# Patient Record
Sex: Male | Born: 1965 | Race: White | Hispanic: No | Smoking: Current every day smoker
Health system: Southern US, Community
[De-identification: ages and names within clinical notes are randomized; demographics above are authoritative.]

## PROBLEM LIST (undated history)

## (undated) DIAGNOSIS — F329 Major depressive disorder, single episode, unspecified: Secondary | ICD-10-CM

## (undated) DIAGNOSIS — F1020 Alcohol dependence, uncomplicated: Secondary | ICD-10-CM

## (undated) DIAGNOSIS — G2581 Restless legs syndrome: Secondary | ICD-10-CM

## (undated) DIAGNOSIS — F32A Depression, unspecified: Secondary | ICD-10-CM

## (undated) DIAGNOSIS — M79606 Pain in leg, unspecified: Secondary | ICD-10-CM

## (undated) DIAGNOSIS — M549 Dorsalgia, unspecified: Secondary | ICD-10-CM

## (undated) DIAGNOSIS — M5430 Sciatica, unspecified side: Secondary | ICD-10-CM

## (undated) DIAGNOSIS — G8929 Other chronic pain: Secondary | ICD-10-CM

## (undated) HISTORY — PX: LEG SURGERY: SHX1003

## (undated) HISTORY — DX: Major depressive disorder, single episode, unspecified: F32.9

## (undated) HISTORY — DX: Depression, unspecified: F32.A

## (undated) HISTORY — DX: Alcohol dependence, uncomplicated: F10.20

## (undated) HISTORY — DX: Restless legs syndrome: G25.81

---

## 2012-07-18 ENCOUNTER — Ambulatory Visit (INDEPENDENT_AMBULATORY_CARE_PROVIDER_SITE_OTHER): Payer: Medicare Other | Admitting: Sports Medicine

## 2012-07-18 ENCOUNTER — Encounter: Payer: Self-pay | Admitting: Sports Medicine

## 2012-07-18 VITALS — BP 115/73 | HR 68 | Temp 98.3°F | Resp 18 | Ht 72.0 in | Wt 173.0 lb

## 2012-07-18 DIAGNOSIS — Z79899 Other long term (current) drug therapy: Secondary | ICD-10-CM

## 2012-07-18 DIAGNOSIS — G8929 Other chronic pain: Secondary | ICD-10-CM

## 2012-07-18 DIAGNOSIS — Z299 Encounter for prophylactic measures, unspecified: Secondary | ICD-10-CM | POA: Insufficient documentation

## 2012-07-18 DIAGNOSIS — N50819 Testicular pain, unspecified: Secondary | ICD-10-CM | POA: Insufficient documentation

## 2012-07-18 DIAGNOSIS — G2581 Restless legs syndrome: Secondary | ICD-10-CM

## 2012-07-18 DIAGNOSIS — F1020 Alcohol dependence, uncomplicated: Secondary | ICD-10-CM

## 2012-07-18 DIAGNOSIS — N509 Disorder of male genital organs, unspecified: Secondary | ICD-10-CM

## 2012-07-18 DIAGNOSIS — N529 Male erectile dysfunction, unspecified: Secondary | ICD-10-CM | POA: Insufficient documentation

## 2012-07-18 MED ORDER — SILDENAFIL CITRATE 100 MG PO TABS: 100.0000 mg | ORAL_TABLET | ORAL | Status: DC | PRN

## 2012-07-18 MED ORDER — GABAPENTIN 300 MG PO CAPS: ORAL_CAPSULE | ORAL | Status: DC

## 2012-07-18 MED ORDER — HYDROCODONE-ACETAMINOPHEN 10-500 MG PO TABS: 1.0000 | ORAL_TABLET | Freq: Two times a day (BID) | ORAL | Status: DC | PRN

## 2012-07-18 NOTE — Progress Notes (Signed)
Patient ID: Jimmy Barron, male   DOB: 1966/08/25, 46 y.o.   MRN: [id number] Subjective:    CC: Establish care.   HPI: Jimmy Barron is a pleasant 46 year old male comes in to discuss several issues.  Restless leg syndrome: He has been on Requip, is not like the way this makes a few, and would like a different option.  Chronic pain: Has a history of a suicide attempt, and off of a bridge. He had a severe fracture of his left femur treated with an ORIF. 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 He's been getting Lortab refills. He requests that I take over his pain management.  Right testicular pain: Does not radiate, does not occur with urination, is only worse with palpation. Has been present for several months, denies fevers, swelling, redness. Has not found any lumps. Is wondering if I can offer the answers. He also denies any swelling in the scrotum.  Erectile dysfunction: Is wondering if he can get some Viagra.  Past medical history, Surgical history, Family history, Social history, Allergies, and medications have been entered into the medical record, reviewed, and no changes needed.   Review of Systems: No headache, visual changes, nausea, vomiting, diarrhea, constipation, dizziness, abdominal pain, skin rash, fevers, chills, night sweats, weight loss, chest pain, body aches, joint swelling, muscle aches, or shortness of breath.   Objective:    General: Well Developed, well nourished, and in no acute distress.  Neuro: Alert and oriented x3, extra-ocular muscles intact.  HEENT: Normocephalic, atraumatic, pupils equal round reactive to light, neck supple, no masses, no lymphadenopathy, thyroid nonpalpable.  Skin: Warm and dry, no rashes noted.  Cardiac: Regular rate and rhythm, no murmurs rubs or gallops.  Respiratory: Clear to auscultation bilaterally. Not using accessory muscles,  speaking in full sentences.  Abdominal: Soft, nontender, nondistended, positive bowel sounds, no masses, no organomegaly.  Musculoskeletal: Shoulder, elbow, wrist, hip, knee, ankle stable, and with full range of motion.  Noted surgical scars over his left hip, and leg. Genitalia: Right testicle is unremarkable in appearance: There's no tenderness to palpation. There is no sign of hernia.  There is no penile discharge.   Impression and Recommendations:

## 2012-07-18 NOTE — Assessment & Plan Note (Signed)
CBC, complete metabolic panel, fasting lipid panel.

## 2012-07-18 NOTE — Assessment & Plan Note (Signed)
Inadequate response with Requip. We'll go ahead and try Neurontin a taper. RTC one month for this.

## 2012-07-18 NOTE — Assessment & Plan Note (Addendum)
One month of Lortab for use up to twice a day given today. He understands this is a one time prescription.  We will go and get him set up with the pain clinic. Eventually I may consider x-rays of his hip, femur, and knee to further assess for loosening of the components.

## 2012-07-18 NOTE — Assessment & Plan Note (Signed)
We will follow this closely. I will offer him help should he desire.

## 2012-07-18 NOTE — Assessment & Plan Note (Signed)
Exam benign. No sign of hernia. We'll go ahead and ultrasound. Referral to urology if no cause found.

## 2012-07-18 NOTE — Assessment & Plan Note (Signed)
Checking testosterone. Viagra

## 2012-07-23 ENCOUNTER — Telehealth: Payer: Self-pay

## 2012-07-23 NOTE — Telephone Encounter (Signed)
Message copied by Chalmers Cater on Wed Jul 23, 2012  4:00 PM ------      Message from: Monica Becton      Created: Wed Jul 23, 2012 10:20 AM       Shoot this guy a call, seems he hasn;t showed up for his labwork yet.  Just shoot him a reminder.      -T

## 2012-07-23 NOTE — Telephone Encounter (Signed)
Left message for patient to call back  

## 2012-07-29 NOTE — Telephone Encounter (Signed)
I called patient again today. His phone number is no longer in service.

## 2012-08-12 ENCOUNTER — Telehealth: Payer: Self-pay | Admitting: *Deleted

## 2012-08-12 NOTE — Telephone Encounter (Signed)
Pt's sister has called stating that the Lortab you prescribed to her brother (whom she is payee on his monthly check and whom he lived with) states that her friend has called her stating that her brother is selling his Lortab and that he informed the friend that he would be getting another refill on the 2nd. She ask that we don't prescribe narcotics to him anymore.

## 2012-08-12 NOTE — Telephone Encounter (Signed)
Noted, no further narcotics from this practice.

## 2012-08-15 ENCOUNTER — Encounter: Payer: Self-pay | Admitting: Sports Medicine

## 2012-08-15 ENCOUNTER — Other Ambulatory Visit: Payer: Self-pay | Admitting: Sports Medicine

## 2012-08-15 ENCOUNTER — Ambulatory Visit (HOSPITAL_BASED_OUTPATIENT_CLINIC_OR_DEPARTMENT_OTHER)
Admission: RE | Admit: 2012-08-15 | Discharge: 2012-08-15 | Disposition: A | Discharge status: Home / Self Care | Payer: Medicare Other | Source: Ambulatory Visit | Attending: Sports Medicine | Admitting: Sports Medicine

## 2012-08-15 ENCOUNTER — Ambulatory Visit (INDEPENDENT_AMBULATORY_CARE_PROVIDER_SITE_OTHER): Payer: Medicare Other | Admitting: Sports Medicine

## 2012-08-15 ENCOUNTER — Telehealth: Payer: Self-pay | Admitting: Sports Medicine

## 2012-08-15 VITALS — BP 124/84 | HR 75 | Wt 175.0 lb

## 2012-08-15 DIAGNOSIS — Z299 Encounter for prophylactic measures, unspecified: Secondary | ICD-10-CM

## 2012-08-15 DIAGNOSIS — N50819 Testicular pain, unspecified: Secondary | ICD-10-CM

## 2012-08-15 DIAGNOSIS — N509 Disorder of male genital organs, unspecified: Secondary | ICD-10-CM

## 2012-08-15 DIAGNOSIS — N529 Male erectile dysfunction, unspecified: Secondary | ICD-10-CM

## 2012-08-15 DIAGNOSIS — G2581 Restless legs syndrome: Secondary | ICD-10-CM

## 2012-08-15 DIAGNOSIS — Z79899 Other long term (current) drug therapy: Secondary | ICD-10-CM

## 2012-08-15 DIAGNOSIS — Z0289 Encounter for other administrative examinations: Secondary | ICD-10-CM

## 2012-08-15 DIAGNOSIS — N508 Other specified disorders of male genital organs: Secondary | ICD-10-CM | POA: Insufficient documentation

## 2012-08-15 DIAGNOSIS — G8929 Other chronic pain: Secondary | ICD-10-CM

## 2012-08-15 LAB — COMPREHENSIVE METABOLIC PANEL
AST: 22 U/L (ref 0–37)
Alkaline Phosphatase: 59 U/L (ref 39–117)
BUN: 15 mg/dL (ref 6–23)
Creat: 0.77 mg/dL (ref 0.50–1.35)
Glucose, Bld: 84 mg/dL (ref 70–99)
Total Bilirubin: 1 mg/dL (ref 0.3–1.2)

## 2012-08-15 LAB — COMPREHENSIVE METABOLIC PANEL WITH GFR
ALT: 19 U/L (ref 0–53)
Albumin: 4.8 g/dL (ref 3.5–5.2)
CO2: 28 meq/L (ref 19–32)
Calcium: 9.8 mg/dL (ref 8.4–10.5)
Chloride: 100 meq/L (ref 96–112)
Potassium: 4.2 meq/L (ref 3.5–5.3)
Sodium: 136 meq/L (ref 135–145)
Total Protein: 7.4 g/dL (ref 6.0–8.3)

## 2012-08-15 LAB — CBC
HCT: 46.8 % (ref 39.0–52.0)
Hemoglobin: 16.2 g/dL (ref 13.0–17.0)
MCH: 32 pg (ref 26.0–34.0)
MCHC: 34.6 g/dL (ref 30.0–36.0)
MCV: 92.5 fL (ref 78.0–100.0)
Platelets: 188 K/uL (ref 150–400)
RBC: 5.06 MIL/uL (ref 4.22–5.81)
RDW: 13.7 % (ref 11.5–15.5)
WBC: 8.1 K/uL (ref 4.0–10.5)

## 2012-08-15 LAB — LIPID PANEL
Cholesterol: 184 mg/dL (ref 0–200)
HDL: 55 mg/dL (ref >39)
LDL Cholesterol: 107 mg/dL — ABNORMAL HIGH (ref 0–99)
Total CHOL/HDL Ratio: 3.3 Ratio
Triglycerides: 112 mg/dL (ref <150)
VLDL: 22 mg/dL (ref 0–40)

## 2012-08-15 LAB — TESTOSTERONE, FREE, TOTAL, SHBG

## 2012-08-15 MED ORDER — KETOROLAC TROMETHAMINE 30 MG/ML IJ SOLN
30.0000 mg | Freq: Once | INTRAMUSCULAR | Status: AC
  Administered 2012-08-15: 30 mg via INTRAMUSCULAR

## 2012-08-15 NOTE — Addendum Note (Signed)
Addended by: Avon Gully C on: 08/15/2012 10:08 AM   Modules accepted: Orders

## 2012-08-15 NOTE — Assessment & Plan Note (Addendum)
CBC, CMET, Testosterone, FLP Up-to-date on tetanus, declines influenza vaccination.

## 2012-08-15 NOTE — Assessment & Plan Note (Signed)
Hasn't yet gotten the Viagra. I would like to check a testosterone level, and replete if needed.

## 2012-08-15 NOTE — Assessment & Plan Note (Signed)
Need to u/s right testicle with hx pain.

## 2012-08-15 NOTE — Assessment & Plan Note (Signed)
Improved with gabapentin. 

## 2012-08-15 NOTE — Progress Notes (Signed)
Subjective:    CC: Followup  HPI: Restless legs: Improved with gabapentin.  Right testicular pain: Still not obtained the ultrasound. No change.  Preventive care: Has not gotten his blood work, he would do this today. He declines influenza vaccination, and notes that his tetanus shot is up-to-date 4 years ago.  Chronic pain: Understands we will not prescribe narcotics, we do have a pain clinic referral pending.  Past medical history, Surgical history, Family history, Social history, Allergies, and medications have been entered into the medical record, reviewed, and no changes needed.   Review of Systems: No fevers, chills, night sweats, weight loss, chest pain, or shortness of breath.   Objective:    General: Well Developed, well nourished, and in no acute distress.  Neuro: Alert and oriented x3, extra-ocular muscles intact.  HEENT: Normocephalic, atraumatic, pupils equal round reactive to light, neck supple, no masses, no lymphadenopathy, thyroid nonpalpable.  Skin: Warm and dry, no rashes. Cardiac: Regular rate and rhythm, no murmurs rubs or gallops.  Respiratory: Clear to auscultation bilaterally. Not using accessory muscles, speaking in full sentences.   Impression and Recommendations:

## 2012-08-15 NOTE — Telephone Encounter (Signed)
Pt notified. KG LPN 

## 2012-08-15 NOTE — Assessment & Plan Note (Addendum)
Still working on pain clinic referral. Can always consider x-ray of the hardware to assess for loosening in the future if needed. He also understands he can continue to taper gabapentin to help his chronic pain. I'm also going to give him a shot of Toradol 30 mg intramuscular today.

## 2012-08-15 NOTE — Telephone Encounter (Signed)
Let him know ultraosund was negative.

## 2012-09-29 ENCOUNTER — Telehealth: Payer: Self-pay

## 2012-09-29 NOTE — Telephone Encounter (Signed)
Left message for patient to call back. There was a message left on the voicemail.

## 2012-12-01 ENCOUNTER — Telehealth: Payer: Self-pay | Admitting: *Deleted

## 2012-12-01 NOTE — Telephone Encounter (Signed)
Sister calls and wanted you to know that her brother is abusing the gabapentin. Is snorting them and selling them and she said she went to pick up his rx from pharmacy and keeps asking her to call up here to have you increase the dose and she is not gonna have any part of it and will be flushing that rx she picked up down the toilet.  She calls to make you aware of what he is doing and states he has a hx of abusing meds

## 2012-12-01 NOTE — Telephone Encounter (Signed)
Thanks, I havent seen him since September, and haven't sent in gabapentin since august.  Will notate his chart.

## 2013-02-12 ENCOUNTER — Ambulatory Visit: Payer: Medicare Other | Admitting: Sports Medicine

## 2013-02-12 DIAGNOSIS — Z0289 Encounter for other administrative examinations: Secondary | ICD-10-CM

## 2013-03-13 ENCOUNTER — Ambulatory Visit (INDEPENDENT_AMBULATORY_CARE_PROVIDER_SITE_OTHER): Payer: Medicare Other

## 2013-03-13 ENCOUNTER — Ambulatory Visit (INDEPENDENT_AMBULATORY_CARE_PROVIDER_SITE_OTHER): Payer: Medicare Other | Admitting: Sports Medicine

## 2013-03-13 ENCOUNTER — Encounter: Payer: Self-pay | Admitting: Sports Medicine

## 2013-03-13 ENCOUNTER — Other Ambulatory Visit: Payer: Self-pay | Admitting: Sports Medicine

## 2013-03-13 VITALS — BP 125/84 | HR 60 | Wt 178.0 lb

## 2013-03-13 DIAGNOSIS — M25569 Pain in unspecified knee: Secondary | ICD-10-CM

## 2013-03-13 DIAGNOSIS — G8929 Other chronic pain: Secondary | ICD-10-CM

## 2013-03-13 DIAGNOSIS — F1020 Alcohol dependence, uncomplicated: Secondary | ICD-10-CM

## 2013-03-13 DIAGNOSIS — IMO0002 Reserved for concepts with insufficient information to code with codable children: Secondary | ICD-10-CM

## 2013-03-13 DIAGNOSIS — S32009A Unspecified fracture of unspecified lumbar vertebra, initial encounter for closed fracture: Secondary | ICD-10-CM

## 2013-03-13 DIAGNOSIS — M25559 Pain in unspecified hip: Secondary | ICD-10-CM

## 2013-03-13 DIAGNOSIS — M79605 Pain in left leg: Secondary | ICD-10-CM | POA: Insufficient documentation

## 2013-03-13 DIAGNOSIS — F329 Major depressive disorder, single episode, unspecified: Secondary | ICD-10-CM

## 2013-03-13 DIAGNOSIS — X58XXXA Exposure to other specified factors, initial encounter: Secondary | ICD-10-CM

## 2013-03-13 DIAGNOSIS — F3289 Other specified depressive episodes: Secondary | ICD-10-CM

## 2013-03-13 DIAGNOSIS — M5416 Radiculopathy, lumbar region: Secondary | ICD-10-CM

## 2013-03-13 DIAGNOSIS — F32A Depression, unspecified: Secondary | ICD-10-CM | POA: Insufficient documentation

## 2013-03-13 MED ORDER — PREDNISONE 50 MG PO TABS: ORAL_TABLET | ORAL | Status: DC

## 2013-03-13 MED ORDER — CYCLOBENZAPRINE HCL 10 MG PO TABS: ORAL_TABLET | ORAL | Status: DC

## 2013-03-13 MED ORDER — MELOXICAM 15 MG PO TABS: ORAL_TABLET | ORAL | Status: DC

## 2013-03-13 NOTE — Assessment & Plan Note (Signed)
Highly possible that the pain is he describes is not related to his femur fracture ORIF, and is more related to lumbar radiculitis. I am going to x-ray his lumbar spine as well as his left knee. Prednisone, Flexeril, Mobic, formal physical therapy. He'll come back in one month.

## 2013-03-13 NOTE — Progress Notes (Signed)
  Subjective:    CC: Followup  HPI: Left leg pain: Phineas has been extremity the pain in his left leg to his distant femur fracture and ORIF. He localizes the pain over the left back, left greater trochanter radiating around to the anterior thigh, anterior knee, lateral lower leg, into the dorsum of his foot causing numbness and tingling. This is worse when sitting in a car, and sitting for long periods of time, no worse with Valsalva. Better with extension. No loss of bowel or bladder function, and no constitutional symptoms. He was seen by a nurse at [facility] during his recent alcohol detox hospitalization. He was placed in a knee immobilizer.  Alcoholism: Recently discharged and is currently 3 weeks sober.  Past medical history, Surgical history, Family history not pertinant except as noted below, Social history, Allergies, and medications have been entered into the medical record, reviewed, and no changes needed.   Review of Systems: No fevers, chills, night sweats, weight loss, chest pain, or shortness of breath.   Objective:    General: Well Developed, well nourished, and in no acute distress.  Neuro: Alert and oriented x3, extra-ocular muscles intact, sensation grossly intact.  HEENT: Normocephalic, atraumatic, pupils equal round reactive to light, neck supple, no masses, no lymphadenopathy, thyroid nonpalpable.  Skin: Warm and dry, no rashes. Cardiac: Regular rate and rhythm, no murmurs rubs or gallops, no lower extremity edema.  Respiratory: Clear to auscultation bilaterally. Not using accessory muscles, speaking in full sentences. Back Exam:  Inspection: Unremarkable  Motion: Flexion 45 deg, Extension 45 deg, Side Bending to 45 deg bilaterally,  Rotation to 45 deg bilaterally  SLR laying: Positive reproduction of radicular symptoms into left foot.  XSLR laying: Negative  Palpable tenderness: None. FABER: negative. Sensory change: Gross sensation intact to all lumbar and  sacral dermatomes.  Reflexes: 2+ at both patellar tendons, 2+ at achilles tendons, Babinski's downgoing.  Strength at foot  Plantar-flexion: 5/5 Dorsi-flexion: 5/5 Eversion: 5/5 Inversion: 5/5  Leg strength  Quad: 5/5 Hamstring: 5/5 Hip flexor: 5/5 Hip abductors: 5/5  Gait unremarkable.  Left Knee: Normal to inspection with no erythema or effusion or obvious bony abnormalities. Palpation normal with no warmth, joint line tenderness, patellar tenderness, or condyle tenderness. ROM full in flexion and extension and lower leg rotation. Ligaments with solid consistent endpoints including ACL, PCL, LCL, MCL. Negative Mcmurray's, Apley's, and Thessalonian tests. Non painful patellar compression. Patellar glide without crepitus. Patellar and quadriceps tendons unremarkable. Hamstring and quadriceps strength is normal.   Impression and Recommendations:

## 2013-03-13 NOTE — Assessment & Plan Note (Signed)
Has had approximately 3 weeks clean. Recently was released from an inpatient detox facility.

## 2013-03-16 ENCOUNTER — Telehealth: Payer: Self-pay | Admitting: *Deleted

## 2013-03-16 MED ORDER — VENLAFAXINE HCL ER 150 MG PO CP24: 150.0000 mg | ORAL_CAPSULE | Freq: Every day | ORAL | Status: DC

## 2013-03-16 NOTE — Telephone Encounter (Signed)
LMOM that Effexor was sent to pharmacy but not Neurotin.

## 2013-03-16 NOTE — Telephone Encounter (Signed)
Pt calls and request a refill on the Neurontin and mood stabalizer. Uses [facility] [address]

## 2013-03-16 NOTE — Telephone Encounter (Signed)
Refilling Effexor. No gabapentin.

## 2013-03-26 ENCOUNTER — Telehealth: Payer: Self-pay | Admitting: *Deleted

## 2013-03-26 NOTE — Telephone Encounter (Signed)
Can't do that one, check specialty comments on the snapshot.

## 2013-03-26 NOTE — Telephone Encounter (Signed)
Thank you for that heads up!

## 2013-03-26 NOTE — Telephone Encounter (Signed)
Pt calls today & is asking if we can refill his gabapentin.  I don't see this on his current med list but it's on his med history list.  Please advise

## 2013-03-27 ENCOUNTER — Telehealth: Payer: Self-pay | Admitting: *Deleted

## 2013-03-27 NOTE — Telephone Encounter (Signed)
Not a problem, exercises are in my out box.

## 2013-03-27 NOTE — Telephone Encounter (Signed)
Because his pain may be coming from his lumbar spine, I wanted him to do the medication as well as physical therapy is recommended in his office visit. He should continue this course, and proceed with physical therapy. If he can't do formal physical therapy we can do home exercises. I went in to see me back one month after the last visit, if no better we will obtain an MRI of his lumbar spine for injection planning. It's too early for pain clinic referral because I have not yet exhausted conservative measures. Please have him bear with me a little longer.

## 2013-03-27 NOTE — Telephone Encounter (Signed)
Pt states if we can print out excercises and place them up front for him to pick up because he states he has trouble getting a ride to PT.

## 2013-03-27 NOTE — Telephone Encounter (Signed)
Pt is asking since we won't refill his gabapentin he wants to know if we can refer him to a pain clinic.

## 2013-04-10 ENCOUNTER — Ambulatory Visit: Payer: Medicare Other | Admitting: Sports Medicine

## 2013-04-10 DIAGNOSIS — Z0289 Encounter for other administrative examinations: Secondary | ICD-10-CM

## 2013-04-14 ENCOUNTER — Other Ambulatory Visit: Payer: Self-pay | Admitting: *Deleted

## 2013-04-14 MED ORDER — VENLAFAXINE HCL ER 150 MG PO CP24: 150.0000 mg | ORAL_CAPSULE | Freq: Every day | ORAL | Status: DC

## 2013-04-15 ENCOUNTER — Ambulatory Visit: Payer: Medicare Other | Admitting: Sports Medicine

## 2013-04-15 DIAGNOSIS — Z0289 Encounter for other administrative examinations: Secondary | ICD-10-CM

## 2014-03-09 ENCOUNTER — Emergency Department (HOSPITAL_COMMUNITY)
Admission: EM | Admit: 2014-03-09 | Discharge: 2014-03-09 | Disposition: A | Discharge status: Home / Self Care | Payer: Medicare Other | Attending: Emergency Medicine | Admitting: Emergency Medicine

## 2014-03-09 ENCOUNTER — Encounter (HOSPITAL_COMMUNITY): Payer: Self-pay | Admitting: Emergency Medicine

## 2014-03-09 DIAGNOSIS — Z79899 Other long term (current) drug therapy: Secondary | ICD-10-CM | POA: Insufficient documentation

## 2014-03-09 DIAGNOSIS — F17200 Nicotine dependence, unspecified, uncomplicated: Secondary | ICD-10-CM | POA: Insufficient documentation

## 2014-03-09 DIAGNOSIS — F329 Major depressive disorder, single episode, unspecified: Secondary | ICD-10-CM | POA: Insufficient documentation

## 2014-03-09 DIAGNOSIS — G8929 Other chronic pain: Secondary | ICD-10-CM | POA: Insufficient documentation

## 2014-03-09 DIAGNOSIS — IMO0002 Reserved for concepts with insufficient information to code with codable children: Secondary | ICD-10-CM | POA: Insufficient documentation

## 2014-03-09 DIAGNOSIS — F3289 Other specified depressive episodes: Secondary | ICD-10-CM | POA: Insufficient documentation

## 2014-03-09 DIAGNOSIS — G2581 Restless legs syndrome: Secondary | ICD-10-CM | POA: Insufficient documentation

## 2014-03-09 DIAGNOSIS — M545 Low back pain, unspecified: Secondary | ICD-10-CM | POA: Insufficient documentation

## 2014-03-09 DIAGNOSIS — M549 Dorsalgia, unspecified: Secondary | ICD-10-CM

## 2014-03-09 DIAGNOSIS — F1021 Alcohol dependence, in remission: Secondary | ICD-10-CM | POA: Insufficient documentation

## 2014-03-09 HISTORY — DX: Dorsalgia, unspecified: M54.9

## 2014-03-09 HISTORY — DX: Pain in leg, unspecified: M79.606

## 2014-03-09 HISTORY — DX: Other chronic pain: G89.29

## 2014-03-09 HISTORY — DX: Sciatica, unspecified side: M54.30

## 2014-03-09 MED ORDER — HYDROCODONE-ACETAMINOPHEN 5-325 MG PO TABS: ORAL_TABLET | ORAL | Status: DC

## 2014-03-09 MED ORDER — METHOCARBAMOL 500 MG PO TABS: 1000.0000 mg | ORAL_TABLET | Freq: Four times a day (QID) | ORAL | Status: DC | PRN

## 2014-03-09 MED ORDER — NAPROXEN 250 MG PO TABS: 250.0000 mg | ORAL_TABLET | Freq: Two times a day (BID) | ORAL | Status: DC

## 2014-03-09 NOTE — ED Notes (Signed)
Pt c/o pain in lower back and left leg.  Reports jumped off bridge 6 years ago and has had problems with back and leg pain since then.

## 2014-03-09 NOTE — ED Provider Notes (Signed)
CSN: [id number]     Arrival date & time 03/09/14  0732 History   First MD Initiated Contact with Patient 03/09/14 [phone]     Chief Complaint  Patient presents with  . Back Pain     HPI Pt was seen at 0745. Per pt, c/o gradual onset and persistence of constant acute flair of his chronic low back "pain" for the past several days.  Denies any change in his usual chronic pain pattern.  Pain worsens with palpation of the area and body position changes. Denies incont/retention of bowel or bladder, no saddle anesthesia, no focal motor weakness, no tingling/numbness in extremities, no fevers, no injury, no abd pain.   The symptoms have been associated with no other complaints. The patient has a significant history of similar symptoms previously, recently being evaluated for this complaint and multiple prior evals for same.     Past Medical History  Diagnosis Date  . Alcoholism   . Depression   . Restless legs syndrome (RLS)   . Chronic back pain   . Sciatica   . Chronic leg pain     left   Past Surgical History  Procedure Laterality Date  . Leg surgery      left   Family History  Problem Relation Age of Onset  . Alcohol abuse Father   . Cancer Father     lung  . Alzheimer's disease Mother    History  Substance Use Topics  . Smoking status: Current Every Day Smoker -- 1.00 packs/day for 30 years    Types: Cigarettes  . Smokeless tobacco: Former NeurosurgeonUser    Types: Chew  . Alcohol Use: Yes     Comment: none since 16 days    Review of Systems ROS: Statement: All systems negative except as marked or noted in the HPI; Constitutional: Negative for fever and chills. ; ; Eyes: Negative for eye pain, redness and discharge. ; ; ENMT: Negative for ear pain, hoarseness, nasal congestion, sinus pressure and sore throat. ; ; Cardiovascular: Negative for chest pain, palpitations, diaphoresis, dyspnea and peripheral edema. ; ; Respiratory: Negative for cough, wheezing and stridor. ; ;  Gastrointestinal: Negative for nausea, vomiting, diarrhea, abdominal pain, blood in stool, hematemesis, jaundice and rectal bleeding. . ; ; Genitourinary: Negative for dysuria, flank pain and hematuria. ; ; Musculoskeletal: +LBP. Negative for neck pain. Negative for swelling and trauma.; ; Skin: Negative for pruritus, rash, abrasions, blisters, bruising and skin lesion.; ; Neuro: Negative for headache, lightheadedness and neck stiffness. Negative for weakness, altered level of consciousness , altered mental status, extremity weakness, paresthesias, involuntary movement, seizure and syncope.        Allergies  Review of patient's allergies indicates no known allergies.  Home Medications   Prior to Admission medications   Medication Sig Start Date End Date Taking? Authorizing Provider  cyclobenzaprine (FLEXERIL) 10 MG tablet One half tab PO qHS, then increase gradually to one tab TID. 03/13/13   Monica Bectonhomas J Thekkekandam, MD  HYDROcodone-acetaminophen (NORCO/VICODIN) 5-325 MG per tablet 1 or 2 tabs PO q6 hours prn pain 03/09/14   Laray AngerKathleen M Antonieta Slaven, DO  hydrOXYzine (ATARAX/VISTARIL) 50 MG tablet Take 50 mg by mouth 3 (three) times daily as needed.    Historical Provider, MD  meloxicam (MOBIC) 15 MG tablet One tab PO qAM with breakfast for 2 weeks, then daily prn pain. 03/13/13   Monica Bectonhomas J Thekkekandam, MD  methocarbamol (ROBAXIN) 500 MG tablet Take 2 tablets (1,000 mg total) by mouth 4 (four)  times daily as needed for muscle spasms (muscle spasm/pain). 03/09/14   Laray AngerKathleen M Christeen Lai, DO  naproxen (NAPROSYN) 250 MG tablet Take 1 tablet (250 mg total) by mouth 2 (two) times daily with a meal. 03/09/14   Laray AngerKathleen M Hanifah Royse, DO  predniSONE (DELTASONE) 50 MG tablet One tab PO daily for 5 days. 03/13/13   Monica Bectonhomas J Thekkekandam, MD  rOPINIRole (REQUIP) 2 MG tablet Take 2 mg by mouth at bedtime.    Historical Provider, MD  sildenafil (VIAGRA) 100 MG tablet Take 1 tablet (100 mg total) by mouth as needed for erectile  dysfunction (for use prior to sexual activity). 07/18/12 08/17/12  Monica Bectonhomas J Thekkekandam, MD  venlafaxine XR (EFFEXOR-XR) 150 MG 24 hr capsule Take 1 capsule (150 mg total) by mouth daily. 04/14/13   Monica Bectonhomas J Thekkekandam, MD   BP 107/79  Pulse 65  Temp(Src) 97.6 F (36.4 C) (Oral)  Resp 20  Ht 6\' 2"  (1.88 m)  Wt 172 lb (78.019 kg)  BMI 22.07 kg/m2  SpO2 100% Physical Exam 0750: Physical examination:  Nursing notes reviewed; Vital signs and O2 SAT reviewed;  Constitutional: Well developed, Well nourished, Well hydrated, In no acute distress; Head:  Normocephalic, atraumatic; Eyes: EOMI, PERRL, No scleral icterus; ENMT: Mouth and pharynx normal, Mucous membranes moist; Neck: Supple, Full range of motion, No lymphadenopathy; Cardiovascular: Regular rate and rhythm, No murmur, rub, or gallop; Respiratory: Breath sounds clear & equal bilaterally, No rales, rhonchi, wheezes.  Speaking full sentences with ease, Normal respiratory effort/excursion; Chest: Nontender, Movement normal; Abdomen: Soft, Nontender, Nondistended, Normal bowel sounds; Genitourinary: No CVA tenderness; Spine:  No midline CS, TS, LS tenderness. +TTP left lumbar paraspinal muscles.;; Extremities: Pulses normal, No tenderness, No edema, No calf edema or asymmetry.; Neuro: AA&Ox3, Major CN grossly intact.  Speech clear. No gross focal motor or sensory deficits in extremities. Strength 5/5 equal bilat UE's and LE's, including great toe dorsiflexion.  DTR 2/4 equal bilat UE's and LE's.  No gross sensory deficits.  Neg straight leg raises bilat. Climbs on and off stretcher easily by himself. Gait steady.; Skin: Color normal, Warm, Dry.   ED Course  Procedures     EKG Interpretation None      MDM  MDM Reviewed: previous chart, nursing note and vitals    0750:  Long hx of chronic pain. Pt endorses acute flair of his usual long standing chronic pain today, no change from his usual chronic pain pattern.  Pt encouraged to f/u with  his PMD and Pain Management doctor for good continuity of care and control of his chronic pain.  Verb understanding.      Laray AngerKathleen M Makailee Nudelman, DO 03/10/14 1928

## 2014-03-09 NOTE — Discharge Instructions (Signed)
°Emergency Department Resource Guide °1) Find a Doctor and Pay Out of Pocket °Although you won't have to find out who is covered by your insurance plan, it is a good idea to ask around and get recommendations. You will then need to call the office and see if the doctor you have chosen will accept you as a new patient and what types of options they offer for patients who are self-pay. Some doctors offer discounts or will set up payment plans for their patients who do not have insurance, but you will need to ask so you aren't surprised when you get to your appointment. ° °2) Contact Your Local Health Department °Not all health departments have doctors that can see patients for sick visits, but many do, so it is worth a call to see if yours does. If you don't know where your local health department is, you can check in your phone book. The CDC also has a tool to help you locate your state's health department, and many state websites also have listings of all of their local health departments. ° °3) Find a Walk-in Clinic °If your illness is not likely to be very severe or complicated, you may want to try a walk in clinic. These are popping up all over the country in pharmacies, drugstores, and shopping centers. They're usually staffed by nurse practitioners or physician assistants that have been trained to treat common illnesses and complaints. They're usually fairly quick and inexpensive. However, if you have serious medical issues or chronic medical problems, these are probably not your best option. ° °No Primary Care Doctor: °- Call Health Connect at  832-8000 - they can help you locate a primary care doctor that  accepts your insurance, provides certain services, etc. °- Physician Referral Service- 1-800-533-3463 ° °Chronic Pain Problems: °Organization         Address  Phone   Notes  °[facility]  (336) 297-2271 Patients need to be referred by their primary care doctor.  ° °Medication  Assistance: °Organization         Address  Phone   Notes  °[place] [facility] [address] °[place], [place] [address] (336) 641-8030 --Must be a resident of [place] °-- Must have NO insurance coverage whatsoever (no Medicaid/ Medicare, etc.) °-- The pt. MUST have a primary care doctor that directs their care regularly and follows them in the community °  °[facility]  (866) 331-1348   °[employer]  (888) 892-1162   ° °Agencies that provide inexpensive medical care: °Organization         Address  Phone   Notes  °[facility]  (336) 832-8035   °[facility]    (336) 832-7272   °[facility] [address] °[place], [place] [address] (336) 832-4777   °[facility] [address], °[place] (336) 271-4999   °[facility]    (336) 373-0678   °[facility]    (336) 272-1050   °[facility] ° [address], [place] Phone:  (336) 832-4444, Fax:  (336) 832-4440 Hours of Operation:  9 am - 6 pm, M-F.  Also accepts Medicaid/Medicare and self-pay.  °[facility] ° [address], [place] Phone: (336) 832-3150, Fax: (336) [phone]. Hours of Operation:  8:30 am - 5:30 pm, M-F.  Also accepts Medicaid and self-pay.  °[facility] [place] [address], [place] Phone: (336) 878-6027   °[facility] [address], [place], [place] (336)723-1848, Ext. 123 Mondays & Thursdays: 7-9 AM.  First 15 patients are seen on a first come, first serve basis. °  ° °Medicaid-accepting [place] Providers: ° °Organization         Address  Phone   Notes  °[facility] [address], [place] (336) 641-2100 Also accepts self-pay patients.  °[facility] [address], [place] ° (336) 856-9996   °[facility] [address], [place]  (336) 288-8857   °[facility] [address] [place] [address], [place] (336) 299-7000   °Veita Bland [address], [place]  ° (336) 373-1557 Only accepts [place] Access Medicaid patients after they have their name applied to their card.  ° °Self-Pay (no insurance) in [place]: ° °Organization         Address  Phone   Notes  °Sickle Cell Patients, [facility] [address], [place] (336) 832-1970   °[facility] [address], [place] (336) 832-4400   °[facility] ° [address], [place] (336) 992-4800   °[facility]/Dr. Osei-Bonsu ° [address] [place] [address], [place] or [address], [place] (336) 841-8500 Phone number for both [place] and [place] locations is the same.  °[facility] [address], [place] (336) 299-0000   °[facility] [address] [place] [address], [place] or [address] (336) 852-7530 °(336) 878-2260   °[facility] [address], [place] (336) 350-1642, phone; (336) 294-5005, fax Sees patients 1st and 3rd Saturday of every month.  Must not qualify for public or private insurance (i.e. Medicaid, Medicare, [place] Health Choice, Veterans' Benefits) • Household income should be no more than 200% of the poverty level •The clinic cannot treat you if you are pregnant or think you are pregnant • Sexually transmitted diseases are not treated at the clinic.  ° ° °Dental Care: °Organization         Address  Phone  Notes  °[place] [facility] [address], [place] (336) 641-6152 Accepts children up to age 21 who are enrolled in Medicaid or [place] Health Choice; pregnant women with a Medicaid card; and children who have applied for Medicaid or [place] Health Choice, but were declined, whose parents can pay a reduced fee at time of service.  °[place]  [facility] [place]  [address], [place] (336) 641-7733 Accepts children up to age 21 who are enrolled in Medicaid or [place] Health Choice; pregnant women with a Medicaid card; and children who have applied for Medicaid or [place] Health Choice, but were declined, whose parents can pay a reduced fee at time of service.  °[facility] ° [address], [place] (336) 641-4533 Patients are seen by appointment only. Walk-ins are not accepted. [facility] will see patients 18 years of age and older. °Monday - Tuesday (8am-5pm) °Most Wednesdays (8:30-5pm) °$30 per visit, cash only  °[facility] ° [address], [place] (336) 641-4533 Patients are seen by appointment only. Walk-ins are not accepted. [facility] will see patients 18 years of age and older. °One   Wednesday Evening (Monthly: Volunteer Based).  $30 per visit, cash only  °[facility]  (919) 537-3737 for adults; Children under age 4, call Graduate Pediatric Dentistry at ([phone]) [phone]. Children aged 4-14, please call (919) [phone] to request a pediatric application. ° Dental services are provided in all areas of dental care including fillings, crowns and bridges, complete and partial dentures, implants, gum treatment, root canals, and extractions. Preventive care is also provided. Treatment is provided to both adults and children. °Patients are selected via a lottery and there is often a waiting list. °  °[facility] [address], °[place] ° (336) 763-8833 [email or web address] °  °[facility] [address], [place], [place] (336)723-1848, Ext. 123 Second and Fourth Thursday of each month, opens at 6:30 AM; Clinic ends at 9 AM.  Patients are seen on a first-come first-served basis, and a limited number are seen during each clinic.  ° °[facility] ° [address], [place], [place] (336) 723-7904    Eligibility Requirements °You must have lived in [place], [place], or [place] counties for at least the last three months. °  You cannot be eligible for state or federal sponsored healthcare insurance, including Veterans Administration, Medicaid, or Medicare. °  You generally cannot be eligible for healthcare insurance through your employer.  °  How to apply: °Eligibility screenings are held every Tuesday and Wednesday afternoon from 1:00 pm until 4:00 pm. You do not need an appointment for the interview!  °[facility] [address], [place], [place] 336-631-2330   °[facility]  336-342-8273   °[facility]  336-703-3100   °[facility]  336-570-6415   ° °Behavioral Health Resources in the Community: °Intensive Outpatient Programs °Organization         Address  Phone  Notes  °[facility] [address], [place], [place] 336-878-6098   °[facility] [address], [place], [place] 336-832-9800   °[facility] [address], [place], [place] ° 336-882-2125   °[facility] [address],  °[place], [place] 1-800-853-5163 or 336-641-4981   °Substance Abuse Resources °Organization         Address  Phone  Notes  °[facility]  336-882-2125   °[facility]  336-784-9470   °[facility]  336-285-9073   °[facility]  336-845-3988   °Residential & Outpatient Substance Abuse Program  1-800-659-3381   °Psychological Services °Organization         Address  Phone  Notes  °[facility]  336- 832-9600   °[facility]  336- 378-7881   °[facility] [address], [place] 1-800-853-5163 or 336-641-4981   ° °Mobile Crisis Teams °Organization         Address  Phone  Notes  °[facility], Mobile Crisis Care Unit  1-877-626-1772   °Assertive °[facility] ° [address]  [place], [place] 336-834-9664   °Sharon DeEsch [address] °[place] 336-554-5454   ° °Self-Help/Support Groups °Organization         Address  Phone             Notes  °[facility]  - variety of support groups  336- 373-1402 Call for more information  °Narcotics Anonymous (NA), [facility] [address], °[place]  2 meetings at this location  ° °  Residential Treatment Programs Organization         Address  Phone  Notes  [facility] [address],    [place]  [phone]   [facility]  [address], [place] [phone], [place], [place] [phone]   [facility] [address], [place] [phone] Admissions: 8am-3pm M-F  [facility] [address]    [place], [place] [phone]   [facility] [address], [place], [place] [phone]   [facility] [address]  [place], [place] [phone]   [facility] Intensive Outpatient [address], [place], [place] [phone]) [phone]   [facility] ([facility] [address]  [place], [place] [phone] or [phone]   [facility] [address] [place], [place] [phone]) [phone] Accepts Medicaid  [facility] [address]  [place] [phone] Substance Abuse/Addiction Treatment   [facility]         Address  Phone  Notes  [facility]  (484)804-0022(888) 845-827-6399   Angie FavaJulie Brannon, PhD [address], [address] [place], [place]   [phone](336) [phone] or ([phone](336) [phone]   [facility]   [address] [place], [place] [phone](336) [phone]) [phone]   [facility] [address], [place], [place] [phone](336) [phone] Insurance/Medicaid/sponsorship through [facility] [address]                                    [place], [place] [phone](336) [phone] Therapy/tele-psych/case    [facility] [address]   [place], [place] [phone](336) [phone]    Dr. Lolly MustacheArfeen  [phone](336) [phone]   [facility]  [employer] [facility] 1) [address], [place] 2) [address], [place] 3)  [address], [place] [phone](336) [phone](336) [phone]  ([phone](336) [phone]   [facility] Child Abuse Hot[facility] 269-815-3008(336) 813-343-7250 or (240) [phone](336) [phone] (After Hours)      Take the prescriptions as directed.  Apply moist heat or ice to the area(s) of discomfort, for 15 minutes at a time, several times per day for the next few days.  Do not fall asleep on a heating or ice pack.  Call your regular medical doctor and the Pain Management doctor today to schedule a follow up appointment this week.  Return to the Emergency Department immediately if worsening.

## 2014-03-14 ENCOUNTER — Encounter (HOSPITAL_COMMUNITY): Payer: Self-pay | Admitting: Emergency Medicine

## 2014-03-14 ENCOUNTER — Emergency Department (HOSPITAL_COMMUNITY)
Admission: EM | Admit: 2014-03-14 | Discharge: 2014-03-14 | Disposition: A | Discharge status: Home / Self Care | Payer: Medicare Other | Attending: Emergency Medicine | Admitting: Emergency Medicine

## 2014-03-14 DIAGNOSIS — Z79899 Other long term (current) drug therapy: Secondary | ICD-10-CM | POA: Insufficient documentation

## 2014-03-14 DIAGNOSIS — F329 Major depressive disorder, single episode, unspecified: Secondary | ICD-10-CM | POA: Insufficient documentation

## 2014-03-14 DIAGNOSIS — F3289 Other specified depressive episodes: Secondary | ICD-10-CM | POA: Insufficient documentation

## 2014-03-14 DIAGNOSIS — Z87828 Personal history of other (healed) physical injury and trauma: Secondary | ICD-10-CM | POA: Insufficient documentation

## 2014-03-14 DIAGNOSIS — IMO0002 Reserved for concepts with insufficient information to code with codable children: Secondary | ICD-10-CM | POA: Insufficient documentation

## 2014-03-14 DIAGNOSIS — M545 Low back pain, unspecified: Secondary | ICD-10-CM | POA: Insufficient documentation

## 2014-03-14 DIAGNOSIS — G2581 Restless legs syndrome: Secondary | ICD-10-CM | POA: Insufficient documentation

## 2014-03-14 DIAGNOSIS — Z791 Long term (current) use of non-steroidal anti-inflammatories (NSAID): Secondary | ICD-10-CM | POA: Insufficient documentation

## 2014-03-14 DIAGNOSIS — Z760 Encounter for issue of repeat prescription: Secondary | ICD-10-CM | POA: Insufficient documentation

## 2014-03-14 DIAGNOSIS — G8929 Other chronic pain: Secondary | ICD-10-CM | POA: Insufficient documentation

## 2014-03-14 DIAGNOSIS — F17200 Nicotine dependence, unspecified, uncomplicated: Secondary | ICD-10-CM | POA: Insufficient documentation

## 2014-03-14 DIAGNOSIS — M549 Dorsalgia, unspecified: Secondary | ICD-10-CM

## 2014-03-14 DIAGNOSIS — F1021 Alcohol dependence, in remission: Secondary | ICD-10-CM | POA: Insufficient documentation

## 2014-03-14 MED ORDER — METHOCARBAMOL 500 MG PO TABS: 500.0000 mg | ORAL_TABLET | Freq: Four times a day (QID) | ORAL | Status: DC

## 2014-03-14 MED ORDER — NAPROXEN 375 MG PO TABS: 375.0000 mg | ORAL_TABLET | Freq: Two times a day (BID) | ORAL | Status: DC

## 2014-03-14 MED ORDER — GABAPENTIN 300 MG PO CAPS: 300.0000 mg | ORAL_CAPSULE | Freq: Three times a day (TID) | ORAL | Status: DC

## 2014-03-14 NOTE — Discharge Instructions (Signed)
Back Pain, Adult  Back pain is very common. The pain often gets better over time. The cause of back pain is usually not dangerous. Most people can learn to manage their back pain on their own.   HOME CARE   · Stay active. Start with short walks on flat ground if you can. Try to walk farther each day.  · Do not sit, drive, or stand in one place for more than 30 minutes. Do not stay in bed.  · Do not avoid exercise or work. Activity can help your back heal faster.  · Be careful when you bend or lift an object. Bend at your knees, keep the object close to you, and do not twist.  · Sleep on a firm mattress. Lie on your side, and bend your knees. If you lie on your back, put a pillow under your knees.  · Only take medicines as told by your doctor.  · Put ice on the injured area.  · Put ice in a plastic bag.  · Place a towel between your skin and the bag.  · Leave the ice on for 15-20 minutes, 03-04 times a day for the first 2 to 3 days. After that, you can switch between ice and heat packs.  · Ask your doctor about back exercises or massage.  · Avoid feeling anxious or stressed. Find good ways to deal with stress, such as exercise.  GET HELP RIGHT AWAY IF:   · Your pain does not go away with rest or medicine.  · Your pain does not go away in 1 week.  · You have new problems.  · You do not feel well.  · The pain spreads into your legs.  · You cannot control when you poop (bowel movement) or pee (urinate).  · Your arms or legs feel weak or lose feeling (numbness).  · You feel sick to your stomach (nauseous) or throw up (vomit).  · You have belly (abdominal) pain.  · You feel like you may pass out (faint).  MAKE SURE YOU:   · Understand these instructions.  · Will watch your condition.  · Will get help right away if you are not doing well or get worse.  Document Released: 04/23/2008 Document Revised: 01/28/2012 Document Reviewed: 03/26/2011  ExitCare® Patient Information ©2014 [employer].

## 2014-03-14 NOTE — ED Notes (Addendum)
Pt reports fell 25 feet off a bridge x6 years ago and injured his back in four places. Pt reports chronic back pain ever since. Pt reports "a back flare-up" that started today. Pt reports is out of prescription for naproxen,gabapentin, and robaxin. Pt denies any abdominal pain, n/v/d, cp,sob,gu symptoms. Pt reports has an appointment with Dr. Felecia ShellingFanta this week for consultation regarding back surgery. Pt reports pain increased too much to wait until that appointment to be seen. Pt denies any new injury or recent fall. Pt reports back pain is worse with movement.

## 2014-03-14 NOTE — ED Provider Notes (Signed)
CSN: [id number]     Arrival date & time 03/14/14  1421 History  This chart was scribed for non-physician practitioner, Kerrie BuffaloHope Davison Ohms, NP, working with Donnetta HutchingBrian Cook, MD by Shari HeritageAisha Amuda, ED Scribe. This patient was seen in room APFT24/APFT24 and the patient's care was started at 4:09 PM.  Chief Complaint  Patient presents with  . Back Pain    Patient is a 48 y.o. male presenting with back pain. The history is provided by the patient. No language interpreter was used.  Back Pain Location:  Lumbar spine Pain severity:  Severe Duration:  1 day Timing:  Constant Chronicity:  Chronic Context: not recent injury   Relieved by:  Muscle relaxants and NSAIDs Associated symptoms: no abdominal pain, no bladder incontinence, no bowel incontinence, no chest pain, no dysuria, no fever, no headaches, no tingling and no weakness     HPI Comments: Jimmy Barron is a 48 y.o. male with 6 year history of chronic back pain who presents to the Emergency Department complaining of a flare of constant, severe, lower back pain that began earlier today. Pain is worse with movement. Patient states that he has an appointment with Dr. Felecia ShellingFanta on 04/02/14, but he is out of multiple medications that he takes for pain control (gabapentin, naproxen and Robaxin). He denies any new recent injury or trauma. Patient denies bowel incontinence, bladder incontinence, numbness/tingling/weakness of extremities, or any other symptoms at this time. Patient is staying in a rehab facility for alcohol abuse treatment. He is not allowed any narcotic medications.    Past Medical History  Diagnosis Date  . Alcoholism   . Depression   . Restless legs syndrome (RLS)   . Chronic back pain   . Sciatica   . Chronic leg pain     left   Past Surgical History  Procedure Laterality Date  . Leg surgery      left   Family History  Problem Relation Age of Onset  . Alcohol abuse Father   . Cancer Father     lung  . Alzheimer's disease Mother     History  Substance Use Topics  . Smoking status: Current Every Day Smoker -- 1.00 packs/day for 30 years    Types: Cigarettes  . Smokeless tobacco: Former NeurosurgeonUser    Types: Chew  . Alcohol Use: No     Comment: none since 16 days    Review of Systems  Constitutional: Negative for fever and chills.  Eyes: Negative for visual disturbance.  Respiratory: Negative for cough and shortness of breath.   Cardiovascular: Negative for chest pain.  Gastrointestinal: Negative for nausea, vomiting, abdominal pain and bowel incontinence.  Genitourinary: Negative for bladder incontinence and dysuria.  Musculoskeletal: Positive for back pain.  Skin: Negative for rash.  Neurological: Negative for tingling, weakness and headaches.  Psychiatric/Behavioral: Negative for confusion.    Allergies  Review of patient's allergies indicates no known allergies.  Home Medications   Prior to Admission medications   Medication Sig Start Date End Date Taking? Authorizing Provider  cyclobenzaprine (FLEXERIL) 10 MG tablet One half tab PO qHS, then increase gradually to one tab TID. 03/13/13   Monica Bectonhomas J Thekkekandam, MD  hydrOXYzine (ATARAX/VISTARIL) 50 MG tablet Take 50 mg by mouth 3 (three) times daily as needed.    Historical Provider, MD  meloxicam (MOBIC) 15 MG tablet One tab PO qAM with breakfast for 2 weeks, then daily prn pain. 03/13/13   Monica Bectonhomas J Thekkekandam, MD  methocarbamol (ROBAXIN) 500 MG tablet  Take 2 tablets (1,000 mg total) by mouth 4 (four) times daily as needed for muscle spasms (muscle spasm/pain). 03/09/14   Laray AngerKathleen M McManus, DO  naproxen (NAPROSYN) 250 MG tablet Take 1 tablet (250 mg total) by mouth 2 (two) times daily with a meal. 03/09/14   Laray AngerKathleen M McManus, DO  predniSONE (DELTASONE) 50 MG tablet One tab PO daily for 5 days. 03/13/13   Monica Bectonhomas J Thekkekandam, MD  rOPINIRole (REQUIP) 2 MG tablet Take 2 mg by mouth at bedtime.    Historical Provider, MD  sildenafil (VIAGRA) 100 MG tablet Take 1  tablet (100 mg total) by mouth as needed for erectile dysfunction (for use prior to sexual activity). 07/18/12 08/17/12  Monica Bectonhomas J Thekkekandam, MD  venlafaxine XR (EFFEXOR-XR) 150 MG 24 hr capsule Take 1 capsule (150 mg total) by mouth daily. 04/14/13   Monica Bectonhomas J Thekkekandam, MD   Triage Vitals: BP 112/80  Pulse 52  Temp(Src) 97.7 F (36.5 C) (Oral)  Resp 13  Ht 6\' 2"  (1.88 m)  Wt 172 lb (78.019 kg)  BMI 22.07 kg/m2  SpO2 99% Physical Exam  Nursing note and vitals reviewed. Constitutional: He is oriented to person, place, and time. He appears well-developed and well-nourished. No distress.  HENT:  Head: Normocephalic and atraumatic.  Eyes: Conjunctivae and EOM are normal. Pupils are equal, round, and reactive to light.  Neck: Normal range of motion. Neck supple.  Cardiovascular: Normal rate, regular rhythm, normal heart sounds and intact distal pulses.   No murmur heard. Pulmonary/Chest: Effort normal and breath sounds normal. No respiratory distress. He has no wheezes. He has no rales.  Abdominal: Soft. Bowel sounds are normal. There is no tenderness.  Musculoskeletal: Normal range of motion. He exhibits no edema.       Lumbar back: He exhibits tenderness. He exhibits normal range of motion, no deformity, no spasm and normal pulse.  Neurological: He is alert and oriented to person, place, and time. He has normal strength. No cranial nerve deficit or sensory deficit. Coordination and gait normal.  Reflex Scores:      Bicep reflexes are 2+ on the right side and 2+ on the left side.      Brachioradialis reflexes are 2+ on the right side and 2+ on the left side.      Patellar reflexes are 2+ on the right side and 2+ on the left side.      Achilles reflexes are 2+ on the right side and 2+ on the left side. Normal reflexes in all extremities. Normal pulses in extremities.   Skin: Skin is warm and dry. He is not diaphoretic.  Psychiatric: He has a normal mood and affect. His behavior is  normal.    ED Course  Procedures (including critical care time) DIAGNOSTIC STUDIES: Oxygen Saturation is 99% on room air, normal by my interpretation.    COORDINATION OF CARE: 4:15 PM- Patient with history of chronic back pain complaining of flare and requesting medication refill. Will discharge home with naproxen, Robaxin and gabapentin. Patient informed of current plan for treatment and evaluation and agrees with plan at this time.    MDM   Final diagnoses:  Back pain  Medication refill   I personally performed the services described in this documentation, which was scribed in my presence. The recorded information has been reviewed and is accurate.   [place] AiryHope M Aicia Babinski, [place]NP 03/14/14 [phone]

## 2014-03-15 NOTE — ED Provider Notes (Signed)
Medical screening examination/treatment/procedure(s) were performed by non-physician practitioner and as supervising physician I was immediately available for consultation/collaboration.   EKG Interpretation None       Donnetta HutchingBrian Matthew Pais, MD 03/15/14 2147

## 2014-04-04 ENCOUNTER — Other Ambulatory Visit (HOSPITAL_COMMUNITY): Payer: Self-pay | Admitting: Internal Medicine

## 2014-04-04 ENCOUNTER — Ambulatory Visit (HOSPITAL_COMMUNITY)
Admission: RE | Admit: 2014-04-04 | Discharge: 2014-04-04 | Disposition: A | Discharge status: Home / Self Care | Payer: Medicare Other | Source: Ambulatory Visit | Attending: Internal Medicine | Admitting: Internal Medicine

## 2014-04-04 ENCOUNTER — Ambulatory Visit (HOSPITAL_COMMUNITY): Payer: Medicare Other

## 2014-04-04 DIAGNOSIS — M25552 Pain in left hip: Secondary | ICD-10-CM

## 2014-04-04 DIAGNOSIS — M545 Low back pain, unspecified: Secondary | ICD-10-CM | POA: Insufficient documentation

## 2014-04-04 DIAGNOSIS — IMO0002 Reserved for concepts with insufficient information to code with codable children: Secondary | ICD-10-CM | POA: Insufficient documentation

## 2014-04-04 DIAGNOSIS — M25569 Pain in unspecified knee: Secondary | ICD-10-CM | POA: Insufficient documentation

## 2014-04-04 DIAGNOSIS — M899 Disorder of bone, unspecified: Secondary | ICD-10-CM | POA: Insufficient documentation

## 2014-04-04 DIAGNOSIS — M25562 Pain in left knee: Secondary | ICD-10-CM

## 2014-04-04 DIAGNOSIS — M949 Disorder of cartilage, unspecified: Secondary | ICD-10-CM

## 2014-04-04 DIAGNOSIS — M25559 Pain in unspecified hip: Secondary | ICD-10-CM | POA: Insufficient documentation

## 2015-05-03 ENCOUNTER — Encounter (HOSPITAL_COMMUNITY): Payer: Self-pay

## 2015-05-03 ENCOUNTER — Emergency Department (HOSPITAL_COMMUNITY)
Admission: EM | Admit: 2015-05-03 | Discharge: 2015-05-03 | Disposition: A | Discharge status: Home / Self Care | Payer: Medicare Other | Attending: Emergency Medicine | Admitting: Emergency Medicine

## 2015-05-03 ENCOUNTER — Emergency Department (HOSPITAL_COMMUNITY): Payer: Medicare Other

## 2015-05-03 DIAGNOSIS — M79652 Pain in left thigh: Secondary | ICD-10-CM | POA: Insufficient documentation

## 2015-05-03 DIAGNOSIS — Z720 Tobacco use: Secondary | ICD-10-CM | POA: Insufficient documentation

## 2015-05-03 DIAGNOSIS — Z791 Long term (current) use of non-steroidal anti-inflammatories (NSAID): Secondary | ICD-10-CM | POA: Insufficient documentation

## 2015-05-03 DIAGNOSIS — G2581 Restless legs syndrome: Secondary | ICD-10-CM | POA: Diagnosis not present

## 2015-05-03 DIAGNOSIS — Z79899 Other long term (current) drug therapy: Secondary | ICD-10-CM | POA: Diagnosis not present

## 2015-05-03 DIAGNOSIS — F329 Major depressive disorder, single episode, unspecified: Secondary | ICD-10-CM | POA: Insufficient documentation

## 2015-05-03 DIAGNOSIS — M79662 Pain in left lower leg: Secondary | ICD-10-CM | POA: Diagnosis present

## 2015-05-03 DIAGNOSIS — Z9889 Other specified postprocedural states: Secondary | ICD-10-CM | POA: Insufficient documentation

## 2015-05-03 DIAGNOSIS — G8929 Other chronic pain: Secondary | ICD-10-CM | POA: Insufficient documentation

## 2015-05-03 DIAGNOSIS — M79606 Pain in leg, unspecified: Secondary | ICD-10-CM

## 2015-05-03 MED ORDER — GABAPENTIN 300 MG PO CAPS: 300.0000 mg | ORAL_CAPSULE | Freq: Three times a day (TID) | ORAL | Status: DC

## 2015-05-03 MED ORDER — TRAMADOL HCL 50 MG PO TABS
50.0000 mg | ORAL_TABLET | Freq: Four times a day (QID) | ORAL | Status: DC | PRN
Start: 2015-05-03 — End: 2015-06-29

## 2015-05-03 NOTE — ED Provider Notes (Signed)
CSN: [id number]     Arrival date & time 05/03/15  1031 History  This chart was scribed for Gilda Crease, MD by Placido Sou, ED scribe. This patient was seen in room APFT22/APFT22 and the patient's care was started at 11:09 AM.    Chief Complaint  Patient presents with  . Leg Pain    The history is provided by the patient. No language interpreter was used.    HPI Comments: Jimmy Barron is a 49 y.o. male who presents to the Emergency Department complaining of chronic, intermittent, left leg pain with worsening symptoms in the last few days. Pt notes having a metal rod in the affected leg and the associated pain beginning s/p the procedure. Pt notes not having an x-ray of the affected leg in the last year and additionally notes scarring to the lateral aspect of his upper left leg from the initial operation. He also notes not taking his prescribed Neurontin in the past year and requested a refill. Pt denies any other associated symptoms.   Past Medical History  Diagnosis Date  . Alcoholism   . Depression   . Restless legs syndrome (RLS)   . Chronic back pain   . Sciatica   . Chronic leg pain     left   Past Surgical History  Procedure Laterality Date  . Leg surgery      left   Family History  Problem Relation Age of Onset  . Alcohol abuse Father   . Cancer Father     lung  . Alzheimer's disease Mother    History  Substance Use Topics  . Smoking status: Current Every Day Smoker -- 1.00 packs/day for 30 years    Types: Cigarettes  . Smokeless tobacco: Former Neurosurgeon    Types: Chew  . Alcohol Use: Yes     Comment: daily- pt says drank 1 beer today    Review of Systems  Musculoskeletal: Positive for myalgias.  All other systems reviewed and are negative.     Allergies  Review of patient's allergies indicates no known allergies.  Home Medications   Prior to Admission medications   Medication Sig Start Date End Date Taking? Authorizing Provider   cyclobenzaprine (FLEXERIL) 10 MG tablet One half tab PO qHS, then increase gradually to one tab TID. 03/13/13   Monica Becton, MD  gabapentin (NEURONTIN) 300 MG capsule Take 1 capsule (300 mg total) by mouth 3 (three) times daily. 03/14/14   Hope Orlene Och, NP  hydrOXYzine (ATARAX/VISTARIL) 50 MG tablet Take 50 mg by mouth 3 (three) times daily as needed.    Historical Provider, MD  meloxicam (MOBIC) 15 MG tablet One tab PO qAM with breakfast for 2 weeks, then daily prn pain. 03/13/13   Monica Becton, MD  methocarbamol (ROBAXIN) 500 MG tablet Take 2 tablets (1,000 mg total) by mouth 4 (four) times daily as needed for muscle spasms (muscle spasm/pain). 03/09/14   Samuel Jester, DO  methocarbamol (ROBAXIN) 500 MG tablet Take 1 tablet (500 mg total) by mouth 4 (four) times daily. 03/14/14   Hope Orlene Och, NP  naproxen (NAPROSYN) 375 MG tablet Take 1 tablet (375 mg total) by mouth 2 (two) times daily. 03/14/14   Hope Orlene Och, NP  predniSONE (DELTASONE) 50 MG tablet One tab PO daily for 5 days. 03/13/13   Monica Becton, MD  rOPINIRole (REQUIP) 2 MG tablet Take 2 mg by mouth at bedtime.    Historical Provider, MD  sildenafil (VIAGRA)  100 MG tablet Take 1 tablet (100 mg total) by mouth as needed for erectile dysfunction (for use prior to sexual activity). 07/18/12 08/17/12  Monica Becton, MD  venlafaxine XR (EFFEXOR-XR) 150 MG 24 hr capsule Take 1 capsule (150 mg total) by mouth daily. 04/14/13   Monica Becton, MD   BP 121/82 mmHg  Pulse 75  Temp(Src) 98 F (36.7 C) (Oral)  Resp 20  Ht  (1.88 m)  Wt 170 lb (77.111 kg)  BMI 21.82 kg/m2  SpO2 100% Physical Exam  Constitutional: He is oriented to person, place, and time. He appears well-developed and well-nourished. No distress.  HENT:  Head: Normocephalic and atraumatic.  Right Ear: Hearing normal.  Left Ear: Hearing normal.  Nose: Nose normal.  Mouth/Throat: Oropharynx is clear and moist and mucous membranes  are normal.  Eyes: Conjunctivae and EOM are normal. Pupils are equal, round, and reactive to light.  Neck: Normal range of motion. Neck supple.  Cardiovascular: Regular rhythm, S1 normal and S2 normal.  Exam reveals no gallop and no friction rub.   No murmur heard. Pulmonary/Chest: Effort normal and breath sounds normal. No respiratory distress. He exhibits no tenderness.  Abdominal: Soft. Normal appearance and bowel sounds are normal. There is no hepatosplenomegaly. There is no tenderness. There is no rebound, no guarding, no tenderness at McBurney's point and negative Murphy's sign. No hernia.  Musculoskeletal: Normal range of motion. He exhibits tenderness.  Normal ROM in hip and knee; normal joints; pain in thigh area  Neurological: He is alert and oriented to person, place, and time. He has normal strength. No cranial nerve deficit or sensory deficit. Coordination normal. GCS eye subscore is 4. GCS verbal subscore is 5. GCS motor subscore is 6.  Skin: Skin is warm, dry and intact. No rash noted. No cyanosis.  Psychiatric: He has a normal mood and affect. His speech is normal and behavior is normal. Thought content normal.  Nursing note and vitals reviewed.   ED Course  Procedures  DIAGNOSTIC STUDIES: Oxygen Saturation is 100% on RA, normal by my interpretation.    COORDINATION OF CARE: 11:11 AM Discussed treatment plan with pt at bedside and pt agreed to plan.  Labs Review Labs Reviewed - No data to display  Imaging Review No results found.   EKG Interpretation None      MDM   Final diagnoses:  None   thigh pain  Presents to the emergency department with complaints of pain in the left thigh area. Patient reports a history of femur fracture years ago with intramedullary rod. He denies any new injury. X-rays obtained to evaluate hardware, no acute abnormality is noted.  Also complaining of his "restless leg syndrome". He reports taking Neurontin for this. Was given a  prescription.  I personally performed the services described in this documentation, which was scribed in my presence. The recorded information has been reviewed and is accurate.    Gilda Crease, MD 05/03/15 1210

## 2015-05-03 NOTE — Discharge Instructions (Signed)
Musculoskeletal Pain Musculoskeletal pain is muscle and boney aches and pains. These pains can occur in any part of the body. Your caregiver may treat you without knowing the cause of the pain. They may treat you if blood or urine tests, X-rays, and other tests were normal.  CAUSES There is often not a definite cause or reason for these pains. These pains may be caused by a type of germ (virus). The discomfort may also come from overuse. Overuse includes working out too hard when your body is not fit. Boney aches also come from weather changes. Bone is sensitive to atmospheric pressure changes. HOME CARE INSTRUCTIONS   Ask when your test results will be ready. Make sure you get your test results.  Only take over-the-counter or prescription medicines for pain, discomfort, or fever as directed by your caregiver. If you were given medications for your condition, do not drive, operate machinery or power tools, or sign legal documents for 24 hours. Do not drink alcohol. Do not take sleeping pills or other medications that may interfere with treatment.  Continue all activities unless the activities cause more pain. When the pain lessens, slowly resume normal activities. Gradually increase the intensity and duration of the activities or exercise.  During periods of severe pain, bed rest may be helpful. Lay or sit in any position that is comfortable.  Putting ice on the injured area.  Put ice in a bag.  Place a towel between your skin and the bag.  Leave the ice on for 15 to 20 minutes, 3 to 4 times a day.  Follow up with your caregiver for continued problems and no reason can be found for the pain. If the pain becomes worse or does not go away, it may be necessary to repeat tests or do additional testing. Your caregiver may need to look further for a possible cause. SEEK IMMEDIATE MEDICAL CARE IF:  You have pain that is getting worse and is not relieved by medications.  You develop chest pain  that is associated with shortness or breath, sweating, feeling sick to your stomach (nauseous), or throw up (vomit).  Your pain becomes localized to the abdomen.  You develop any new symptoms that seem different or that concern you. MAKE SURE YOU:   Understand these instructions.  Will watch your condition.  Will get help right away if you are not doing well or get worse. Document Released: 11/05/2005 Document Revised: 01/28/2012 Document Reviewed: 07/10/2013 [facility] Patient Information 2015 [place], [place]. This information is not intended to replace advice given to you by your health care provider. Make sure you discuss any questions you have with your health care provider.  Restless Legs Syndrome Restless legs syndrome is a movement disorder. It may also be called a sensorimotor disorder.  CAUSES  No one knows what specifically causes restless legs syndrome, but it tends to run in families. It is also more common in people with low iron, in pregnancy, in people who need dialysis, and those with nerve damage (neuropathy).Some medications may make restless legs syndrome worse.Those medications include drugs to treat high blood pressure, some heart conditions, nausea, colds, allergies, and depression. SYMPTOMS Symptoms include uncomfortable sensations in the legs. These leg sensations are worse during periods of inactivity or rest. They are also worse while sitting or lying down. Individuals that have the disorder describe sensations in the legs that feel like:  Pulling.  Drawing.  Crawling.  Worming.  Boring.  Tingling.  Pins and needles.  Prickling.  Pain. The sensations are usually accompanied by an overwhelming urge to move the legs. Sudden muscle jerks may also occur. Movement provides temporary relief from the discomfort. In rare cases, the arms may also be affected. Symptoms may interfere with going to sleep (sleep onset insomnia). Restless legs syndrome may also be  related to periodic limb movement disorder (PLMD). PLMD is another more common motor disorder. It also causes interrupted sleep. The symptoms from PLMD usually occur most often when you are awake. TREATMENT  Treatment for restless legs syndrome is symptomatic. This means that the symptoms are treated.   Massage and cold compresses may provide temporary relief.  Walk, stretch, or take a cold or hot bath.  Get regular exercise and a good night's sleep.  Avoid caffeine, alcohol, nicotine, and medications that can make it worse.  Do activities that provide mental stimulation like discussions, needlework, and video games. These may be helpful if you are not able to walk or stretch. Some medications are effective in relieving the symptoms. However, many of these medications have side effects. Ask your caregiver about medications that may help your symptoms. Correcting iron deficiency may improve symptoms for some patients. Document Released: 10/26/2002 Document Revised: 03/22/2014 Document Reviewed: 02/01/2011 [facility] Patient Information 2015 [place], [place]. This information is not intended to replace advice given to you by your health care provider. Make sure you discuss any questions you have with your health care provider.

## 2015-05-03 NOTE — ED Notes (Signed)
Pt reports history of traumatic injury to left leg and has a metal rod in leg.  Pt says pain flares up intermittently.  Reports woke up with pain in left thigh this morning.

## 2015-06-22 ENCOUNTER — Emergency Department (HOSPITAL_COMMUNITY): Payer: Medicare Other

## 2015-06-22 ENCOUNTER — Encounter (HOSPITAL_COMMUNITY): Payer: Self-pay | Admitting: Emergency Medicine

## 2015-06-22 ENCOUNTER — Emergency Department (HOSPITAL_COMMUNITY)
Admission: EM | Admit: 2015-06-22 | Discharge: 2015-06-22 | Disposition: A | Discharge status: Home / Self Care | Payer: Medicare Other | Attending: Emergency Medicine | Admitting: Emergency Medicine

## 2015-06-22 DIAGNOSIS — Y9289 Other specified places as the place of occurrence of the external cause: Secondary | ICD-10-CM | POA: Insufficient documentation

## 2015-06-22 DIAGNOSIS — F329 Major depressive disorder, single episode, unspecified: Secondary | ICD-10-CM | POA: Diagnosis not present

## 2015-06-22 DIAGNOSIS — S20211A Contusion of right front wall of thorax, initial encounter: Secondary | ICD-10-CM | POA: Insufficient documentation

## 2015-06-22 DIAGNOSIS — Z791 Long term (current) use of non-steroidal anti-inflammatories (NSAID): Secondary | ICD-10-CM | POA: Diagnosis not present

## 2015-06-22 DIAGNOSIS — Y998 Other external cause status: Secondary | ICD-10-CM | POA: Insufficient documentation

## 2015-06-22 DIAGNOSIS — Z720 Tobacco use: Secondary | ICD-10-CM | POA: Diagnosis not present

## 2015-06-22 DIAGNOSIS — Y9389 Activity, other specified: Secondary | ICD-10-CM | POA: Diagnosis not present

## 2015-06-22 DIAGNOSIS — G8929 Other chronic pain: Secondary | ICD-10-CM | POA: Insufficient documentation

## 2015-06-22 DIAGNOSIS — G2581 Restless legs syndrome: Secondary | ICD-10-CM | POA: Insufficient documentation

## 2015-06-22 DIAGNOSIS — S022XXA Fracture of nasal bones, initial encounter for closed fracture: Secondary | ICD-10-CM | POA: Diagnosis not present

## 2015-06-22 DIAGNOSIS — S0992XA Unspecified injury of nose, initial encounter: Secondary | ICD-10-CM | POA: Diagnosis present

## 2015-06-22 DIAGNOSIS — Z79899 Other long term (current) drug therapy: Secondary | ICD-10-CM | POA: Diagnosis not present

## 2015-06-22 LAB — I-STAT CHEM 8, ED
BUN: 8 mg/dL (ref 6–20)
CHLORIDE: 101 mmol/L (ref 101–111)
Calcium, Ion: 1.22 mmol/L (ref 1.12–1.23)
Creatinine, Ser: 0.9 mg/dL (ref 0.61–1.24)
Glucose, Bld: 92 mg/dL (ref 65–99)
HEMATOCRIT: 47 % (ref 39.0–52.0)
HEMOGLOBIN: 16 g/dL (ref 13.0–17.0)
Potassium: 4.3 mmol/L (ref 3.5–5.1)
Sodium: 140 mmol/L (ref 135–145)
TCO2: 25 mmol/L (ref 0–100)

## 2015-06-22 MED ORDER — IOHEXOL 300 MG/ML  SOLN
100.0000 mL | Freq: Once | INTRAMUSCULAR | Status: AC | PRN
  Administered 2015-06-22: 100 mL via INTRAVENOUS

## 2015-06-22 MED ORDER — NAPROXEN 500 MG PO TABS: 500.0000 mg | ORAL_TABLET | Freq: Two times a day (BID) | ORAL | Status: DC

## 2015-06-22 NOTE — ED Notes (Signed)
Patient removed IV himself. Found IV catheter in trash.
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
## 2015-06-22 NOTE — Discharge Instructions (Signed)

## 2015-06-22 NOTE — ED Notes (Signed)
Patient pacing around nursing station. States he needs to leave because his ride is here.
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
## 2015-06-25 ENCOUNTER — Inpatient Hospital Stay (HOSPITAL_COMMUNITY)
Admission: AD | Admit: 2015-06-25 | Discharge: 2015-06-29 | DRG: 897 | Disposition: A | Discharge status: Home / Self Care | Payer: Medicare Other | Source: Intra-hospital | Attending: Psychiatry | Admitting: Psychiatry

## 2015-06-25 ENCOUNTER — Emergency Department (HOSPITAL_COMMUNITY)
Admission: EM | Admit: 2015-06-25 | Discharge: 2015-06-25 | Disposition: A | Discharge status: Other Acute Inpatient Hospital | Payer: Medicare Other | Attending: Emergency Medicine | Admitting: Emergency Medicine

## 2015-06-25 ENCOUNTER — Encounter (HOSPITAL_COMMUNITY): Payer: Self-pay | Admitting: Emergency Medicine

## 2015-06-25 ENCOUNTER — Encounter (HOSPITAL_COMMUNITY): Payer: Self-pay | Admitting: *Deleted

## 2015-06-25 DIAGNOSIS — F17210 Nicotine dependence, cigarettes, uncomplicated: Secondary | ICD-10-CM | POA: Diagnosis present

## 2015-06-25 DIAGNOSIS — F1910 Other psychoactive substance abuse, uncomplicated: Secondary | ICD-10-CM

## 2015-06-25 DIAGNOSIS — G2581 Restless legs syndrome: Secondary | ICD-10-CM | POA: Insufficient documentation

## 2015-06-25 DIAGNOSIS — F333 Major depressive disorder, recurrent, severe with psychotic symptoms: Secondary | ICD-10-CM | POA: Diagnosis present

## 2015-06-25 DIAGNOSIS — Z720 Tobacco use: Secondary | ICD-10-CM | POA: Diagnosis not present

## 2015-06-25 DIAGNOSIS — Z590 Homelessness: Secondary | ICD-10-CM | POA: Diagnosis not present

## 2015-06-25 DIAGNOSIS — Z79899 Other long term (current) drug therapy: Secondary | ICD-10-CM | POA: Insufficient documentation

## 2015-06-25 DIAGNOSIS — Z811 Family history of alcohol abuse and dependence: Secondary | ICD-10-CM | POA: Diagnosis not present

## 2015-06-25 DIAGNOSIS — M5430 Sciatica, unspecified side: Secondary | ICD-10-CM | POA: Diagnosis not present

## 2015-06-25 DIAGNOSIS — G8929 Other chronic pain: Secondary | ICD-10-CM | POA: Insufficient documentation

## 2015-06-25 DIAGNOSIS — F1020 Alcohol dependence, uncomplicated: Secondary | ICD-10-CM

## 2015-06-25 DIAGNOSIS — F1410 Cocaine abuse, uncomplicated: Secondary | ICD-10-CM | POA: Insufficient documentation

## 2015-06-25 DIAGNOSIS — F1010 Alcohol abuse, uncomplicated: Secondary | ICD-10-CM | POA: Diagnosis present

## 2015-06-25 DIAGNOSIS — F329 Major depressive disorder, single episode, unspecified: Secondary | ICD-10-CM | POA: Insufficient documentation

## 2015-06-25 DIAGNOSIS — R45851 Suicidal ideations: Secondary | ICD-10-CM | POA: Diagnosis present

## 2015-06-25 DIAGNOSIS — Z791 Long term (current) use of non-steroidal anti-inflammatories (NSAID): Secondary | ICD-10-CM | POA: Insufficient documentation

## 2015-06-25 LAB — CBC WITH DIFFERENTIAL/PLATELET
Basophils Absolute: 0 10*3/uL (ref 0.0–0.1)
Basophils Relative: 1 % (ref 0–1)
EOS PCT: 1 % (ref 0–5)
Eosinophils Absolute: 0 10*3/uL (ref 0.0–0.7)
HCT: 41.4 % (ref 39.0–52.0)
Hemoglobin: 14.2 g/dL (ref 13.0–17.0)
LYMPHS PCT: 27 % (ref 12–46)
Lymphs Abs: 1 10*3/uL (ref 0.7–4.0)
MCH: 35.4 pg — AB (ref 26.0–34.0)
MCHC: 34.3 g/dL (ref 30.0–36.0)
MCV: 103.2 fL — ABNORMAL HIGH (ref 78.0–100.0)
Monocytes Absolute: 0.6 10*3/uL (ref 0.1–1.0)
Monocytes Relative: 15 % — ABNORMAL HIGH (ref 3–12)
NEUTROS PCT: 56 % (ref 43–77)
Neutro Abs: 2.1 10*3/uL (ref 1.7–7.7)
Platelets: 104 10*3/uL — ABNORMAL LOW (ref 150–400)
RBC: 4.01 MIL/uL — ABNORMAL LOW (ref 4.22–5.81)
RDW: 13.5 % (ref 11.5–15.5)
WBC: 3.7 10*3/uL — ABNORMAL LOW (ref 4.0–10.5)

## 2015-06-25 LAB — COMPREHENSIVE METABOLIC PANEL
ALK PHOS: 98 U/L (ref 38–126)
ALT: 140 U/L — ABNORMAL HIGH (ref 17–63)
AST: 211 U/L — AB (ref 15–41)
Albumin: 3.9 g/dL (ref 3.5–5.0)
Anion gap: 12 (ref 5–15)
BUN: 9 mg/dL (ref 6–20)
CALCIUM: 8.8 mg/dL — AB (ref 8.9–10.3)
CO2: 26 mmol/L (ref 22–32)
Chloride: 98 mmol/L — ABNORMAL LOW (ref 101–111)
Creatinine, Ser: 0.63 mg/dL (ref 0.61–1.24)
GFR calc non Af Amer: >60 mL/min (ref >60)
GLUCOSE: 94 mg/dL (ref 65–99)
Potassium: 4.3 mmol/L (ref 3.5–5.1)
Sodium: 136 mmol/L (ref 135–145)
Total Bilirubin: 0.7 mg/dL (ref 0.3–1.2)
Total Protein: 6.8 g/dL (ref 6.5–8.1)

## 2015-06-25 LAB — ETHANOL: Alcohol, Ethyl (B): 69 mg/dL — ABNORMAL HIGH (ref <5)

## 2015-06-25 LAB — RAPID URINE DRUG SCREEN, HOSP PERFORMED
AMPHETAMINES: NOT DETECTED
BENZODIAZEPINES: NOT DETECTED
Barbiturates: NOT DETECTED
Cocaine: NOT DETECTED
Opiates: NOT DETECTED
Tetrahydrocannabinol: NOT DETECTED

## 2015-06-25 MED ORDER — LOPERAMIDE HCL 2 MG PO CAPS: 2.0000 mg | ORAL_CAPSULE | ORAL | Status: AC | PRN

## 2015-06-25 MED ORDER — ALUM & MAG HYDROXIDE-SIMETH 200-200-20 MG/5ML PO SUSP: 30.0000 mL | ORAL | Status: DC | PRN

## 2015-06-25 MED ORDER — ACETAMINOPHEN 325 MG PO TABS
650.0000 mg | ORAL_TABLET | Freq: Four times a day (QID) | ORAL | Status: DC | PRN
  Administered 2015-06-25: 650 mg via ORAL
  Filled 2015-06-25: qty 2

## 2015-06-25 MED ORDER — MAGNESIUM HYDROXIDE 400 MG/5ML PO SUSP: 30.0000 mL | Freq: Every day | ORAL | Status: DC | PRN

## 2015-06-25 MED ORDER — VENLAFAXINE HCL ER 150 MG PO CP24
150.0000 mg | ORAL_CAPSULE | Freq: Every day | ORAL | Status: DC
  Administered 2015-06-25 – 2015-06-27 (×3): 150 mg via ORAL
  Filled 2015-06-25 (×6): qty 1

## 2015-06-25 MED ORDER — ADULT MULTIVITAMIN W/MINERALS CH
1.0000 | ORAL_TABLET | Freq: Every day | ORAL | Status: DC
  Administered 2015-06-25 – 2015-06-29 (×4): 1 via ORAL
  Filled 2015-06-25 (×8): qty 1

## 2015-06-25 MED ORDER — THIAMINE HCL 100 MG/ML IJ SOLN: 100.0000 mg | Freq: Once | INTRAMUSCULAR | Status: DC

## 2015-06-25 MED ORDER — ROPINIROLE HCL 1 MG PO TABS
2.0000 mg | ORAL_TABLET | Freq: Every day | ORAL | Status: DC
  Administered 2015-06-25 – 2015-06-28 (×3): 2 mg via ORAL
  Filled 2015-06-25 (×7): qty 2

## 2015-06-25 MED ORDER — ENSURE ENLIVE PO LIQD
237.0000 mL | Freq: Two times a day (BID) | ORAL | Status: DC
  Administered 2015-06-25 – 2015-06-28 (×2): 237 mL via ORAL

## 2015-06-25 MED ORDER — HYDROXYZINE HCL 25 MG PO TABS
25.0000 mg | ORAL_TABLET | Freq: Four times a day (QID) | ORAL | Status: AC | PRN
  Administered 2015-06-25 – 2015-06-26 (×3): 25 mg via ORAL
  Filled 2015-06-25 (×3): qty 1

## 2015-06-25 MED ORDER — LORAZEPAM 1 MG PO TABS
1.0000 mg | ORAL_TABLET | Freq: Four times a day (QID) | ORAL | Status: AC | PRN
  Administered 2015-06-26: 1 mg via ORAL
  Filled 2015-06-25: qty 1

## 2015-06-25 MED ORDER — ONDANSETRON 4 MG PO TBDP
4.0000 mg | ORAL_TABLET | Freq: Four times a day (QID) | ORAL | Status: AC | PRN
  Administered 2015-06-26 (×2): 4 mg via ORAL
  Filled 2015-06-25 (×2): qty 1

## 2015-06-25 MED ORDER — GABAPENTIN 300 MG PO CAPS
300.0000 mg | ORAL_CAPSULE | Freq: Three times a day (TID) | ORAL | Status: DC
  Administered 2015-06-25 – 2015-06-27 (×7): 300 mg via ORAL
  Filled 2015-06-25 (×13): qty 1

## 2015-06-25 MED ORDER — MELOXICAM 7.5 MG PO TABS
15.0000 mg | ORAL_TABLET | Freq: Every day | ORAL | Status: DC | PRN
  Filled 2015-06-25: qty 2

## 2015-06-25 MED ORDER — VITAMIN B-1 100 MG PO TABS
100.0000 mg | ORAL_TABLET | Freq: Every day | ORAL | Status: DC
  Administered 2015-06-26 – 2015-06-29 (×4): 100 mg via ORAL
  Filled 2015-06-25 (×6): qty 1

## 2015-06-25 NOTE — ED Notes (Signed)
TTS at bedside. 

## 2015-06-25 NOTE — [facility] Assessment (Signed)
[facility] Assessment Progress Note  Spoke with Dr. Deretha Emory and took history of pt. Pt here for detox only, MD requests full evaluation/placement by TTS. ED staff will put machine in room.

## 2015-06-25 NOTE — Progress Notes (Signed)
Did not attend AA Speaker group, remained in room resting

## 2015-06-25 NOTE — ED Notes (Signed)
Breakfast tray given to pt 

## 2015-06-25 NOTE — ED Notes (Signed)
Pt says he has chronic back and stomach pain.  Drink 8- 10 forty ounces per/today.

## 2015-06-25 NOTE — BH Assessment (Addendum)
Tele Assessment Note   Jimmy Barron is an 49 y.o. male who came to [facility] requesting alcohol detox. He states he has been drinking 8-10 40 oz beers per day since about last December. He also occasionally uses cocaine, last use 1 week ago. Pt also states that he was staying in a hotel, but is currently homeless and has no family supports. He endorses depressive symptoms with suicidal ideations off and on with not specific plan or intent.  He also states that he chronically off an on "hears voices of people behind him, but they are not there, and i look like a fool".  Pt has a history of several suicide attempts over the years including one about 9 years ago in which he jumped off a bridge and now has metal rods in his legs. He states he has had several pyschiatric admissions over the years.  During assessment, pt was casually dressed, and cooperative, with depressed mood and sad affect. He denies HI, current SI, AVH and history of violence. There was no evidence of pt responding to internal stimuli. He was oriented to person, place and situation, but did not know what day or month it is. Pt's movement, thought content and speech were normal. He states that he did get some treatment 9 months ago at [facility] house, but has no current Op providers.  Pt accepted to 302-1 by Nanine Means, DNP, and he can come after 12 noon.    Axis I: Alcohol Abuse and Depressive Disorder NOS Axis II: Deferred Axis III:  Past Medical History  Diagnosis Date  . Alcoholism   . Depression   . Restless legs syndrome (RLS)   . Chronic back pain   . Sciatica   . Chronic leg pain     left   Axis IV: economic problems, housing problems and problems with primary support group Axis V: 41-50 serious symptoms  Past Medical History:  Past Medical History  Diagnosis Date  . Alcoholism   . Depression   . Restless legs syndrome (RLS)   . Chronic back pain   . Sciatica   . Chronic leg pain     left    Past Surgical  History  Procedure Laterality Date  . Leg surgery      left    Family History:  Family History  Problem Relation Age of Onset  . Alcohol abuse Father   . Cancer Father     lung  . Alzheimer's disease Mother     Social History:  reports that he has been smoking Cigarettes.  He has a 30 pack-year smoking history. He has quit using smokeless tobacco. His smokeless tobacco use included Chew. He reports that he drinks alcohol. He reports that he does not use illicit drugs.  Additional Social History:  Alcohol / Drug Use Pain Medications: denies Prescriptions: denies Over the Counter: denies History of alcohol / drug use?: Yes Longest period of sobriety (when/how long): 1 week Negative Consequences of Use: Personal relationships, Work / Programmer, multimedia Withdrawal Symptoms: Tremors Substance #1 Name of Substance 1: alcohol 1 - Age of First Use: 15 1 - Amount (size/oz): 8-10 40 oz 1 - Frequency: daily 1 - Duration: 8 months 1 - Last Use / Amount: 3:30 am 1 cup of beer Substance #2 Name of Substance 2: cocaine 2 - Age of First Use: 30 2 - Amount (size/oz): varies 2 - Frequency: varies/occasional 2 - Duration: ongoing 2 - Last Use / Amount: 1 week ago  CIWA:  CIWA-Ar BP: 125/94 mmHg Pulse Rate: 67 Nausea and Vomiting: no nausea and no vomiting Tactile Disturbances: none Tremor: two Auditory Disturbances: not present Paroxysmal Sweats: no sweat visible Visual Disturbances: not present Anxiety: two Headache, Fullness in Head: none present Agitation: normal activity Orientation and Clouding of Sensorium: cannot do serial additions or is uncertain about date CIWA-Ar Total: 5 COWS:    PATIENT STRENGTHS: (choose at least two) Average or above average intelligence Capable of independent living Communication skills  Allergies: No Known Allergies  Home Medications:  (Not in a hospital admission)  OB/GYN Status:  No LMP for male patient.  General Assessment Data Location of  Assessment: [facility] T[facility] Assessment: In system Is this a Tele or Face-to-Face Assessment?: Tele Assessment Is this an Initial Assessment or a Re-assessment for this encounter?: Initial Assessment Marital status: Divorced Living Arrangements: Alone (homeless) Can pt return to current living arrangement?: Yes Admission Status: Voluntary Is patient capable of signing voluntary admission?: Yes Referral Source: Self/Family/Friend Insurance type: [facility]     Crisis Care Plan Living Arrangements: Alone (homeless) Name of Psychiatrist: none Name of Therapist: none  Education Status Is patient currently in school?: No Highest grade of school patient has completed: 10  Risk to self with the past 6 months Suicidal Ideation: No-Not Currently/Within Last 6 Months Has patient been a risk to self within the past 6 months prior to admission? : No Suicidal Intent: No Has patient had any suicidal intent within the past 6 months prior to admission? : No Is patient at risk for suicide?: No Suicidal Plan?: No Has patient had any suicidal plan within the past 6 months prior to admission? : No Access to Means: No What has been your use of drugs/alcohol within the last 12 months?: see SA Previous Attempts/Gestures: Yes How many times?:  (several) Other Self Harm Risks:  (none known) Triggers for Past Attempts:  (SA) Intentional Self Injurious Behavior: None Family Suicide History: Yes (mom attempted) Recent stressful life event(s): Financial Problems (homeless) Persecutory voices/beliefs?: No Depression: Yes Depression Symptoms: Insomnia, Fatigue, Isolating, Guilt, Feeling worthless/self pity, Loss of interest in usual pleasures, Feeling angry/irritable Substance abuse history and/or treatment for substance abuse?: Yes Suicide prevention information given to non-admitted patients: Not applicable  Risk to Others within the past 6 months Homicidal Ideation: No Does patient have any lifetime risk of  violence toward others beyond the six months prior to admission? : No Thoughts of Harm to Others: No Current Homicidal Intent: No Current Homicidal Plan: No Access to Homicidal Means: No History of harm to others?: No Assessment of Violence: None Noted Does patient have access to weapons?: No Criminal Charges Pending?: No Does patient have a court date: No Is patient on probation?: No  Psychosis Hallucinations:  (sometimes thinks he hears voices of someone behind him) Delusions: None noted  Mental Status Report Appearance/Hygiene: Unremarkable Eye Contact: Good Motor Activity: Unremarkable Speech: Logical/coherent Level of Consciousness: Alert Mood: Depressed, Sad Affect: Depressed, Sad Anxiety Level: None Thought Processes: Coherent, Relevant Judgement: Partial Orientation: Person, Place, Situation, Appropriate for developmental age Obsessive Compulsive Thoughts/Behaviors: None  Cognitive Functioning Concentration: Poor Memory: Recent Impaired, Remote Intact IQ: Average Insight: Fair Impulse Control: Fair Appetite: Poor Weight Loss:  (unknown) Weight Gain: 0 Sleep: Decreased Total Hours of Sleep: 2 Vegetative Symptoms: Decreased grooming  ADLScreening [facility] Assessment Services) Patient's cognitive ability adequate to safely complete daily activities?: Yes Patient able to express need for assistance with ADLs?: Yes Independently performs ADLs?: Yes (appropriate for developmental age)  Prior Inpatient Therapy Prior Inpatient Therapy: Yes Prior Therapy Dates: 9 months ago Prior Therapy Facilty/Provider(s): [facility] Reason for Treatment: ETOH  Prior Outpatient Therapy Prior Outpatient Therapy: No Does patient have an ACCT team?: No Does patient have Intensive In-House Services?  : No Does patient have [facility] services? : No Does patient have P4CC services?: No  ADL Screening (condition at time of admission) Patient's cognitive ability adequate to safely  complete daily activities?: Yes Is the patient deaf or have difficulty hearing?: No Does the patient have difficulty seeing, even when wearing glasses/contacts?: No Does the patient have difficulty concentrating, remembering, or making decisions?: No Patient able to express need for assistance with ADLs?: Yes Does the patient have difficulty dressing or bathing?: No Independently performs ADLs?: Yes (appropriate for developmental age) Does the patient have difficulty walking or climbing stairs?: No Weakness of Legs: None Weakness of Arms/Hands: None  Home Assistive Devices/Equipment Home Assistive Devices/Equipment: None    Abuse/Neglect Assessment (Assessment to be complete while patient is alone) Physical Abuse: Denies Verbal Abuse: Denies Sexual Abuse: Denies Exploitation of patient/patient's resources: Denies Self-Neglect: Denies Values / Beliefs Cultural Requests During Hospitalization: None Spiritual Requests During Hospitalization: None   Advance Directives (For Healthcare) Does patient have an advance directive?: No Would patient like information on creating an advanced directive?: No - patient declined information Nutrition Screen- [facility] Adult/[facility] Patient's home diet: Regular Has the patient recently lost weight without trying?: Yes, 2-13 lbs. Has the patient been eating poorly because of a decreased appetite?: Yes Malnutrition Screening Tool Score: 2  Additional Information 1:1 In Past 12 Months?: No CIRT Risk: No Elopement Risk: No Does patient have medical clearance?: Yes     Disposition:  Disposition Initial Assessment Completed for this Encounter: Yes Disposition of Patient: Inpatient treatment program Type of inpatient treatment program: Adult  [facility] 06/25/2015 8:22 AM

## 2015-06-25 NOTE — ED Notes (Signed)
Here for detox of ETOH.  Last drank bud ice this am.

## 2015-06-25 NOTE — ED Notes (Signed)
Pt gave consent to transport to  [facility] in [place] for further evaluation.

## 2015-06-25 NOTE — ED Provider Notes (Addendum)
CSN: [id number]     Arrival date & time 06/25/15  4098 History   First MD Initiated Contact with Patient 06/25/15 [phone]     No chief complaint on file.    (Consider location/radiation/quality/duration/timing/severity/associated sxs/prior Treatment) The history is provided by the patient.   patient states that he abuses alcohol and cocaine. Last had alcohol this morning last used cocaine 2 days ago. Patient requesting inpatient detox. Patient went through detox proximal when 9 months ago. Patient without any specific complaints. Denies suicidal ideation.  Past Medical History  Diagnosis Date  . Alcoholism   . Depression   . Restless legs syndrome (RLS)   . Chronic back pain   . Sciatica   . Chronic leg pain     left   Past Surgical History  Procedure Laterality Date  . Leg surgery      left   Family History  Problem Relation Age of Onset  . Alcohol abuse Father   . Cancer Father     lung  . Alzheimer's disease Mother    History  Substance Use Topics  . Smoking status: Current Every Day Smoker -- 1.00 packs/day for 30 years    Types: Cigarettes  . Smokeless tobacco: Former Neurosurgeon    Types: Chew  . Alcohol Use: Yes     Comment: daily- pt reports "drinks all day."    Review of Systems  Constitutional: Negative for fever.  HENT: Negative for congestion.   Eyes: Negative for visual disturbance.  Respiratory: Negative for shortness of breath.   Cardiovascular: Negative for chest pain.  Gastrointestinal: Negative for nausea, vomiting and abdominal pain.  Genitourinary: Negative for dysuria.  Musculoskeletal: Negative for back pain.  Skin: Negative for rash.  Neurological: Negative for headaches.  Hematological: Does not bruise/bleed easily.  Psychiatric/Behavioral: Negative for confusion.      Allergies  Review of patient's allergies indicates no known allergies.  Home Medications   Prior to Admission medications   Medication Sig Start Date End Date Taking?  Authorizing Provider  cyclobenzaprine (FLEXERIL) 10 MG tablet One half tab PO qHS, then increase gradually to one tab TID. 03/13/13   Monica Becton, MD  gabapentin (NEURONTIN) 300 MG capsule Take 1 capsule (300 mg total) by mouth 3 (three) times daily. 05/03/15   Gilda Crease, MD  hydrOXYzine (ATARAX/VISTARIL) 50 MG tablet Take 50 mg by mouth 3 (three) times daily as needed.    Historical Provider, MD  meloxicam (MOBIC) 15 MG tablet One tab PO qAM with breakfast for 2 weeks, then daily prn pain. 03/13/13   Monica Becton, MD  methocarbamol (ROBAXIN) 500 MG tablet Take 2 tablets (1,000 mg total) by mouth 4 (four) times daily as needed for muscle spasms (muscle spasm/pain). 03/09/14   Samuel Jester, DO  methocarbamol (ROBAXIN) 500 MG tablet Take 1 tablet (500 mg total) by mouth 4 (four) times daily. 03/14/14   Hope Orlene Och, NP  naproxen (NAPROSYN) 500 MG tablet Take 1 tablet (500 mg total) by mouth 2 (two) times daily. 06/22/15   Glynn Octave, MD  predniSONE (DELTASONE) 50 MG tablet One tab PO daily for 5 days. 03/13/13   Monica Becton, MD  rOPINIRole (REQUIP) 2 MG tablet Take 2 mg by mouth at bedtime.    Historical Provider, MD  sildenafil (VIAGRA) 100 MG tablet Take 1 tablet (100 mg total) by mouth as needed for erectile dysfunction (for use prior to sexual activity). 07/18/12 08/17/12  Monica Becton, MD  traMADol (  ULTRAM) 50 MG tablet Take 1 tablet (50 mg total) by mouth every 6 (six) hours as needed. 05/03/15   Gilda Crease, MD  venlafaxine XR (EFFEXOR-XR) 150 MG 24 hr capsule Take 1 capsule (150 mg total) by mouth daily. 04/14/13   Monica Becton, MD   BP 112/86 mmHg  Pulse 62  Temp(Src) 97.8 F (36.6 C) (Oral)  Resp 18  Ht  (1.88 m)  Wt 170 lb (77.111 kg)  BMI 21.82 kg/m2  SpO2 99% Physical Exam  Constitutional: He is oriented to person, place, and time. He appears well-developed and well-nourished. No distress.  HENT:  Head:  Normocephalic and atraumatic.  Mouth/Throat: Oropharynx is clear and moist.  Eyes: Conjunctivae and EOM are normal. Pupils are equal, round, and reactive to light.  Neck: Normal range of motion.  Cardiovascular: Normal rate, regular rhythm and normal heart sounds.   Pulmonary/Chest: Effort normal and breath sounds normal. No respiratory distress.  Abdominal: Soft. Bowel sounds are normal. There is no tenderness.  Musculoskeletal: Normal range of motion.  Neurological: He is alert and oriented to person, place, and time. No cranial nerve deficit. He exhibits normal muscle tone. Coordination normal.  Skin: Skin is warm.  Nursing note and vitals reviewed.   ED Course  Procedures (including critical care time) Labs Review Labs Reviewed  ETHANOL - Abnormal; Notable for the following:    Alcohol, Ethyl (B) 69 (*)    All other components within normal limits  COMPREHENSIVE METABOLIC PANEL - Abnormal; Notable for the following:    Chloride 98 (*)    Calcium 8.8 (*)    AST 211 (*)    ALT 140 (*)    All other components within normal limits  CBC WITH DIFFERENTIAL/PLATELET - Abnormal; Notable for the following:    WBC 3.7 (*)    RBC 4.01 (*)    MCV 103.2 (*)    MCH 35.4 (*)    Platelets 104 (*)    Monocytes Relative 15 (*)    All other components within normal limits  URINE RAPID DRUG SCREEN, HOSP PERFORMED   Results for orders placed or performed during the hospital encounter of 06/25/15  Ethanol  Result Value Ref Range   Alcohol, Ethyl (B) 69 (H) <5 mg/dL  Comprehensive metabolic panel  Result Value Ref Range   Sodium 136 135 - 145 mmol/L   Potassium 4.3 3.5 - 5.1 mmol/L   Chloride 98 (L) 101 - 111 mmol/L   CO2 26 22 - 32 mmol/L   Glucose, Bld 94 65 - 99 mg/dL   BUN 9 6 - 20 mg/dL   Creatinine, Ser 1.61 0.61 - 1.24 mg/dL   Calcium 8.8 (L) 8.9 - 10.3 mg/dL   Total Protein 6.8 6.5 - 8.1 g/dL   Albumin 3.9 3.5 - 5.0 g/dL   AST 096 (H) 15 - 41 U/L   ALT 140 (H) 17 - 63 U/L    Alkaline Phosphatase 98 38 - 126 U/L   Total Bilirubin 0.7 0.3 - 1.2 mg/dL   GFR calc non Af Amer >60 >60 mL/min   GFR calc Af Amer >60 >60 mL/min   Anion gap 12 5 - 15  CBC with Differential/Platelet  Result Value Ref Range   WBC 3.7 (L) 4.0 - 10.5 K/uL   RBC 4.01 (L) 4.22 - 5.81 MIL/uL   Hemoglobin 14.2 13.0 - 17.0 g/dL   HCT 04.5 40.9 - 81.1 %   MCV 103.2 (H) 78.0 - 100.0 fL  MCH 35.4 (H) 26.0 - 34.0 pg   MCHC 34.3 30.0 - 36.0 g/dL   RDW 18.8 41.6 - 60.6 %   Platelets 104 (L) 150 - 400 K/uL   Neutrophils Relative % 56 43 - 77 %   Neutro Abs 2.1 1.7 - 7.7 K/uL   Lymphocytes Relative 27 12 - 46 %   Lymphs Abs 1.0 0.7 - 4.0 K/uL   Monocytes Relative 15 (H) 3 - 12 %   Monocytes Absolute 0.6 0.1 - 1.0 K/uL   Eosinophils Relative 1 0 - 5 %   Eosinophils Absolute 0.0 0.0 - 0.7 K/uL   Basophils Relative 1 0 - 1 %   Basophils Absolute 0.0 0.0 - 0.1 K/uL  Urine rapid drug screen (hosp performed)  Result Value Ref Range   Opiates NONE DETECTED NONE DETECTED   Cocaine NONE DETECTED NONE DETECTED   Benzodiazepines NONE DETECTED NONE DETECTED   Amphetamines NONE DETECTED NONE DETECTED   Tetrahydrocannabinol NONE DETECTED NONE DETECTED   Barbiturates NONE DETECTED NONE DETECTED     Imaging Review No results found.   EKG Interpretation None      MDM   Final diagnoses:  Alcoholism  Polysubstance abuse    Patient claimed that he has polysubstance abuse. The based on urine drug screen seems only alcohol screen use lately. Patient was evaluated by behavioral health team for possible inpatient detox. From medical standpoint patient is cleared. Patient very functional here. Patient may end up just with outpatient referral information.    Vanetta Mulders, MD 06/25/15 1011  Patient accepted by: Behavioral health, Dr. Dub Mikes. Patient will be transferred for admission.  Vanetta Mulders, MD 06/25/15 1135

## 2015-06-25 NOTE — Progress Notes (Signed)
Patient ID: Jimmy Barron, male   DOB: [birth year], 49 y.o.   MRN: [id number] Admission Note-Transfer from [facility] ED here for detox. He reports drinking 10 40 oz beers daily, and has been drinking at this rate for "a long time" clarifying long time he says years. On admission to the ED his alcohol level was 69.  He is vol. And has no health problems reported. He has a history of left leg pain from jumping off a bridge in a suicide attempt 9 years ago. He started drinking at 49 yo and the longest period of sobriety he has had is 3 months when in rehab. He has been in rehab in [place], his home state 16-17 times. He states he is here to stop drinking so he doesn't die. He said his drinking is causing problems for him with his family and his job. 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 Was cooperative and demonstrated humor. He ate very little of his lunch and states he has lost 7 pounds in the past 2 weeks from not eating, but rather drinking alcohol. He is tall and thin. He denies being suicidal or homicidal now, can contract for safety, but has attempted suicide several times in his past. He has a positive family history of alcoholism in his father who is deceased. Oriented to unit and settling in.

## 2015-06-25 NOTE — ED Notes (Signed)
Voluntary consent obtained and faxed to [facility] by margaret, Diplomatic Services operational officer.

## 2015-06-26 DIAGNOSIS — R45851 Suicidal ideations: Secondary | ICD-10-CM

## 2015-06-26 DIAGNOSIS — F1020 Alcohol dependence, uncomplicated: Principal | ICD-10-CM

## 2015-06-26 DIAGNOSIS — F333 Major depressive disorder, recurrent, severe with psychotic symptoms: Secondary | ICD-10-CM | POA: Diagnosis present

## 2015-06-26 MED ORDER — IBUPROFEN 400 MG PO TABS: 400.0000 mg | ORAL_TABLET | Freq: Four times a day (QID) | ORAL | Status: DC | PRN

## 2015-06-26 MED ORDER — MELOXICAM 15 MG PO TABS
15.0000 mg | ORAL_TABLET | Freq: Every day | ORAL | Status: DC
  Administered 2015-06-26 – 2015-06-29 (×3): 15 mg via ORAL
  Filled 2015-06-26 (×4): qty 1
  Filled 2015-06-26: qty 2
  Filled 2015-06-26 (×2): qty 1

## 2015-06-26 MED ORDER — LIDOCAINE 5 % EX PTCH
1.0000 | MEDICATED_PATCH | Freq: Every day | CUTANEOUS | Status: DC
  Administered 2015-06-26: 1 via TRANSDERMAL
  Filled 2015-06-26 (×6): qty 1

## 2015-06-26 NOTE — [facility] Group Notes (Signed)
[facility] LCSW Group Therapy  06/26/2015 10:10 - 11 AM  Type of Therapy:  Group Therapy  Participation Level:  Did Not Attend; patient unable to attend due to detox symptoms  Carney Bern, LCSW

## 2015-06-26 NOTE — Progress Notes (Signed)
NUTRITION ASSESSMENT  Pt identified as at risk on the Malnutrition Screen Tool  INTERVENTION: 1. Educated patient on the importance of nutrition and encouraged intake of food and beverages. 2. Discussed weight goals. 3. Supplements: Continue Ensure Enlive po BID, each supplement provides 350 kcal and 20 grams of protein   NUTRITION DIAGNOSIS: Unintentional weight loss related to sub-optimal intake as evidenced by pt report.   Goal: Pt to meet >/= 90% of their estimated nutrition needs.  Monitor:  PO intake  Assessment:  Pt admitted with ETOH abuse and depression.   Pt has been ordered Ensure per [facility]NS protocol. Per weight history, pt's weight has remained stable.   Height: Ht Readings from Last 1 Encounters:  06/25/15  (1.88 m)    Weight: Wt Readings from Last 1 Encounters:  06/25/15 170 lb (77.111 kg)    Weight Hx: Wt Readings from Last 10 Encounters:  06/25/15 170 lb (77.111 kg)  06/22/15 180 lb (81.647 kg)  05/03/15 170 lb (77.111 kg)  03/14/14 172 lb (78.019 kg)  03/09/14 172 lb (78.019 kg)  03/13/13 178 lb (80.74 kg)  08/15/12 175 lb (79.379 kg)  07/18/12 173 lb (78.472 kg)    BMI:  There is no weight on file to calculate BMI. -No height recorded  Estimated Nutritional Needs: Kcal: 25-30 kcal/kg Protein: > 1 gram protein/kg Fluid: 1 ml/kcal  Diet Order: Diet regular Room service appropriate?: Yes; Fluid consistency:: Thin Pt is also offered choice of unit snacks mid-morning and mid-afternoon.  Pt is eating as desired.   Lab results and medications reviewed.   Tilda Franco, MS, RD, LDN Pager: [phone] After Hours Pager: [phone]

## 2015-06-26 NOTE — H&P (Signed)
Psychiatric Admission Assessment Adult  Patient Identification: Jimmy Barron MRN:  [id number] Date of Evaluation:  06/26/2015 Chief Complaint:  MDD ETOH ABUSE Principal Diagnosis: Alcohol use disorder, severe, dependence Diagnosis:   Patient Active Problem List   Diagnosis Date Noted  . Alcohol use disorder, severe, dependence [F10.20] 06/25/2015    Priority: High  . MDD (major depressive disorder), recurrent, severe, with psychosis [F33.3] 06/26/2015  . Depression [F32.9] 03/13/2013  . Testicular pain [N50.8] 07/18/2012  . Erectile dysfunction [N52.9] 07/18/2012  . Chronic pain in the left leg status post femur fracture and ORIF [G89.29] 07/18/2012  . Preventive measure [Z41.8] 07/18/2012  . Alcoholism [F10.20]   . Restless legs syndrome (RLS) [G25.81]    History of Present Illness::  Upon arrival, Jimmy Barron is an 49 y.o. male who came to [facility] requesting alcohol detox. He states he has been drinking 8-10 40 oz beers per day since about last December. He also occasionally uses cocaine, last use 1 week ago. Pt also states that he was staying in a hotel, but is currently homeless and has no family supports. He endorses depressive symptoms with suicidal ideations off and on with not specific plan or intent. He also states that he chronically off an on "hears voices of people behind him, but they are not there, and i look like a fool". Pt has a history of several suicide attempts over the years including one about 9 years ago in which he jumped off a bridge and now has metal rods in his legs. He states he has had several pyschiatric admissions over the years.  During assessment, pt was casually dressed, and cooperative, with depressed mood and sad affect. He denies HI, current SI, AVH and history of violence. There was no evidence of pt responding to internal stimuli. He was oriented to person, place and situation, but did not know what day or month it is. Pt's movement, thought content and  speech were normal. He states that he did get some treatment 9 months ago at [facility], but has no current Op providers.  On 06/26/15, H&P performed. Pt seen and chart reviewed. Pt reports that he is concerned about his alcohol consumption which he states is 8-10 forty-ounce beers daily. 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 Pt also reports SSI income and that he is OK financially. Pt has many somatic complaints including bilateral leg pain, abdominal pain, and lower back pain, some of which he claims is from jumping from a bridge 9 years ago. Pt now clearly denies suicidal/homicidal ideation and does not appear to be responding to internal stimuli. However, he does report auditory hallucinations in the form of mumbling. Pt is calm, cooperative, alert/oriented x4, and appropriate during assessment.   Elements:  Location:  Psychiatric. Quality:  Worsening. Severity:  Severe. Timing:  Constnat. Duration:  Chronic. Context:  Exacerbation of underlying alcohol abuse with unknown trigger. Associated Signs/Symptoms: Depression Symptoms:  depressed mood, anhedonia, insomnia, psychomotor agitation, psychomotor retardation, fatigue, feelings of worthlessness/guilt, difficulty concentrating, hopelessness, impaired memory, recurrent thoughts of death, anxiety, loss of energy/fatigue, disturbed sleep, (Hypo) Manic Symptoms:  Impulsivity, Irritable Mood, Labiality of Mood, Anxiety Symptoms:  Excessive Worry, Psychotic Symptoms:  Denies PTSD Symptoms: NA Total Time spent with patient: 45 minutes  Past Medical History:  Past Medical History  Diagnosis Date  . Alcoholism   . Depression   . Restless legs syndrome (RLS)   . Chronic back pain   . Sciatica   .  Chronic leg pain     left    Past Surgical History  Procedure Laterality Date  . Leg surgery      left   Family History:  Family History  Problem Relation Age of Onset  .  Alcohol abuse Father   . Cancer Father     lung  . Alzheimer's disease Mother    Social History:  History  Alcohol Use  . Yes    Comment: daily- pt reports "drinks all day."     History  Drug Use No    History   Social History  . Marital Status: Divorced    Spouse Name: N/A  . Number of Children: N/A  . Years of Education: N/A   Social History Main Topics  . Smoking status: Current Every Day Smoker -- 1.00 packs/day for 30 years    Types: Cigarettes  . Smokeless tobacco: Former Systems developer    Types: Chew  . Alcohol Use: Yes     Comment: daily- pt reports "drinks all day."  . Drug Use: No  . Sexual Activity: Not Currently   Other Topics Concern  . None   Social History Narrative   Additional Social History:    Pain Medications: not abusing Prescriptions: not abusing Over the Counter: not abusing History of alcohol / drug use?: Yes Longest period of sobriety (when/how long): 3 months when in a rehab Negative Consequences of Use: Financial, Personal relationships Withdrawal Symptoms:  (anxiety) Name of Substance 1: alcohol 1 - Age of First Use: 15 1 - Amount (size/oz): 8-10 40 oz 1 - Frequency: daily 1 - Duration: years 1 - Last Use / Amount: 3:30 am 1 cup of beer Name of Substance 2: cocaine 2 - Age of First Use: 30 2 - Amount (size/oz): varies 2 - Frequency: varies/occasional 2 - Duration: ongoing 2 - Last Use / Amount: 1 week ago                 Musculoskeletal: Strength & Muscle Tone: within normal limits Gait & Station: normal Patient leans: N/A  Psychiatric Specialty Exam: Physical Exam  Review of Systems  Musculoskeletal: Positive for myalgias, back pain and joint pain (bilateral legs).  Psychiatric/Behavioral: Positive for depression, hallucinations (voices, mumbling) and substance abuse (ETOH, 8-10 forty-ounce beers daily by report). The patient is nervous/anxious and has insomnia.   All other systems reviewed and are negative.   Blood  pressure 96/69, pulse 87, temperature 98 F (36.7 C), temperature source Oral, resp. rate 19.There is no weight on file to calculate BMI.     General Appearance: Disheveled  Eye Contact:: Minimal  Speech: Slow  Volume: Decreased  Mood: Anxious, Depressed, Dysphoric and Hopeless  Affect: Constricted  Thought Process: Disorganized  Orientation: Full (Time, Place, and Person)  Thought Content: Rumination  Suicidal Thoughts: Yes. without intent/plan  Homicidal Thoughts: No  Memory: Immediate; Fair Recent; Fair Remote; Fair  Judgement: Impaired  Insight: Lacking  Psychomotor Activity: Decreased  Concentration: Fair  Recall: [employer] of Knowledge:Fair  Language: Good  Akathisia: No  Handed: Right  AIMS (if indicated):    Assets: Communication Skills Desire for Improvement  Sleep: Number of Hours: 6.75  Cognition: WNL  ADL's: Intact        Risk to Self: Is patient at risk for suicide?: No Risk to Others:   Prior Inpatient Therapy:   Prior Outpatient Therapy:    Alcohol Screening: 1. How often do you have a drink containing alcohol?: 4 or more  times a week 2. How many drinks containing alcohol do you have on a typical day when you are drinking?: 10 or more 3. How often do you have six or more drinks on one occasion?: Daily or almost daily Preliminary Score: 8 4. How often during the last year have you found that you were not able to stop drinking once you had started?: Daily or almost daily 5. How often during the last year have you failed to do what was normally expected from you becasue of drinking?: Weekly 6. How often during the last year have you needed a first drink in the morning to get yourself going after a heavy drinking session?: Daily or almost daily 7. How often during the last year have you had a feeling of guilt of remorse after drinking?: Weekly 8. How often during the last year have you been unable to  remember what happened the night before because you had been drinking?: Weekly 9. Have you or someone else been injured as a result of your drinking?: No 10. Has a relative or friend or a doctor or another health worker been concerned about your drinking or suggested you cut down?: Yes, during the last year Alcohol Use Disorder Identification Test Final Score (AUDIT): 33 Brief Intervention: Yes  Allergies:  No Known Allergies Lab Results:  Results for orders placed or performed during the hospital encounter of 06/25/15 (from the past 48 hour(s))  Urine rapid drug screen (hosp performed)     Status: None   Collection Time: 06/25/15  7:53 AM  Result Value Ref Range   Opiates NONE DETECTED NONE DETECTED   Cocaine NONE DETECTED NONE DETECTED   Benzodiazepines NONE DETECTED NONE DETECTED   Amphetamines NONE DETECTED NONE DETECTED   Tetrahydrocannabinol NONE DETECTED NONE DETECTED   Barbiturates NONE DETECTED NONE DETECTED    Comment:        DRUG SCREEN FOR MEDICAL PURPOSES ONLY.  IF CONFIRMATION IS NEEDED FOR ANY PURPOSE, NOTIFY LAB WITHIN 5 DAYS.        LOWEST DETECTABLE LIMITS FOR URINE DRUG SCREEN Drug Class       Cutoff (ng/mL) Amphetamine      1000 Barbiturate      200 Benzodiazepine   161 Tricyclics       096 Opiates          300 Cocaine          300 THC              50   Ethanol     Status: Abnormal   Collection Time: 06/25/15  8:41 AM  Result Value Ref Range   Alcohol, Ethyl (B) 69 (H) <5 mg/dL    Comment:        LOWEST DETECTABLE LIMIT FOR SERUM ALCOHOL IS 5 mg/dL FOR MEDICAL PURPOSES ONLY   Comprehensive metabolic panel     Status: Abnormal   Collection Time: 06/25/15  8:41 AM  Result Value Ref Range   Sodium 136 135 - 145 mmol/L   Potassium 4.3 3.5 - 5.1 mmol/L   Chloride 98 (L) 101 - 111 mmol/L   CO2 26 22 - 32 mmol/L   Glucose, Bld 94 65 - 99 mg/dL   BUN 9 6 - 20 mg/dL   Creatinine, Ser 0.63 0.61 - 1.24 mg/dL   Calcium 8.8 (L) 8.9 - 10.3 mg/dL   Total  Protein 6.8 6.5 - 8.1 g/dL   Albumin 3.9 3.5 - 5.0 g/dL   AST 211 (H)  15 - 41 U/L   ALT 140 (H) 17 - 63 U/L   Alkaline Phosphatase 98 38 - 126 U/L   Total Bilirubin 0.7 0.3 - 1.2 mg/dL   GFR calc non Af Amer >60 >60 mL/min   GFR calc Af Amer >60 >60 mL/min    Comment: (NOTE) The eGFR has been calculated using the CKD EPI equation. This calculation has not been validated in all clinical situations. eGFR's persistently <60 mL/min signify possible Chronic Kidney Disease.    Anion gap 12 5 - 15  CBC with Differential/Platelet     Status: Abnormal   Collection Time: 06/25/15  8:41 AM  Result Value Ref Range   WBC 3.7 (L) 4.0 - 10.5 K/uL   RBC 4.01 (L) 4.22 - 5.81 MIL/uL   Hemoglobin 14.2 13.0 - 17.0 g/dL   HCT 41.4 39.0 - 52.0 %   MCV 103.2 (H) 78.0 - 100.0 fL   MCH 35.4 (H) 26.0 - 34.0 pg   MCHC 34.3 30.0 - 36.0 g/dL   RDW 13.5 11.5 - 15.5 %   Platelets 104 (L) 150 - 400 K/uL    Comment: SPECIMEN CHECKED FOR CLOTS PLATELET COUNT CONFIRMED BY SMEAR    Neutrophils Relative % 56 43 - 77 %   Neutro Abs 2.1 1.7 - 7.7 K/uL   Lymphocytes Relative 27 12 - 46 %   Lymphs Abs 1.0 0.7 - 4.0 K/uL   Monocytes Relative 15 (H) 3 - 12 %   Monocytes Absolute 0.6 0.1 - 1.0 K/uL   Eosinophils Relative 1 0 - 5 %   Eosinophils Absolute 0.0 0.0 - 0.7 K/uL   Basophils Relative 1 0 - 1 %   Basophils Absolute 0.0 0.0 - 0.1 K/uL   Current Medications: Current Facility-Administered Medications  Medication Dose Route Frequency Provider Last Rate Last Dose  . acetaminophen (TYLENOL) tablet 650 mg  650 mg Oral Q6H PRN Benjamine Mola, FNP   650 mg at 06/25/15 2124  . alum & mag hydroxide-simeth (MAALOX/MYLANTA) 200-200-20 MG/5ML suspension 30 mL  30 mL Oral Q4H PRN Benjamine Mola, FNP      . feeding supplement (ENSURE ENLIVE) (ENSURE ENLIVE) liquid 237 mL  237 mL Oral BID BM Benjamine Mola, FNP   237 mL at 06/25/15 1534  . gabapentin (NEURONTIN) capsule 300 mg  300 mg Oral TID Benjamine Mola, FNP   300 mg  at 06/26/15 0818  . hydrOXYzine (ATARAX/VISTARIL) tablet 25 mg  25 mg Oral Q6H PRN Benjamine Mola, FNP   25 mg at 06/25/15 1535  . loperamide (IMODIUM) capsule 2-4 mg  2-4 mg Oral PRN Benjamine Mola, FNP      . LORazepam (ATIVAN) tablet 1 mg  1 mg Oral Q6H PRN Benjamine Mola, FNP   1 mg at 06/26/15 0820  . magnesium hydroxide (MILK OF MAGNESIA) suspension 30 mL  30 mL Oral Daily PRN Benjamine Mola, FNP      . meloxicam (MOBIC) tablet 15 mg  15 mg Oral Daily PRN Benjamine Mola, FNP      . multivitamin with minerals tablet 1 tablet  1 tablet Oral Daily Benjamine Mola, FNP   1 tablet at 06/26/15 0818  . ondansetron (ZOFRAN-ODT) disintegrating tablet 4 mg  4 mg Oral Q6H PRN Benjamine Mola, FNP   4 mg at 06/26/15 0160  . rOPINIRole (REQUIP) tablet 2 mg  2 mg Oral QHS Benjamine Mola, FNP   2 mg  at 06/25/15 2124  . thiamine (B-1) injection 100 mg  100 mg Intramuscular Once Benjamine Mola, FNP   100 mg at 06/25/15 1538  . thiamine (VITAMIN B-1) tablet 100 mg  100 mg Oral Daily Benjamine Mola, FNP   100 mg at 06/26/15 0818  . venlafaxine XR (EFFEXOR-XR) 24 hr capsule 150 mg  150 mg Oral Daily Benjamine Mola, FNP   150 mg at 06/26/15 0818   PTA Medications: Prescriptions prior to admission  Medication Sig Dispense Refill Last Dose  . cyclobenzaprine (FLEXERIL) 10 MG tablet One half tab PO qHS, then increase gradually to one tab TID. 30 tablet 0   . gabapentin (NEURONTIN) 300 MG capsule Take 1 capsule (300 mg total) by mouth 3 (three) times daily. 60 capsule 0   . hydrOXYzine (ATARAX/VISTARIL) 50 MG tablet Take 50 mg by mouth 3 (three) times daily as needed.   Not Taking  . meloxicam (MOBIC) 15 MG tablet One tab PO qAM with breakfast for 2 weeks, then daily prn pain. 30 tablet 3   . methocarbamol (ROBAXIN) 500 MG tablet Take 1 tablet (500 mg total) by mouth 4 (four) times daily. 30 tablet 0   . naproxen (NAPROSYN) 500 MG tablet Take 1 tablet (500 mg total) by mouth 2 (two) times daily. 30 tablet 0   .  predniSONE (DELTASONE) 50 MG tablet One tab PO daily for 5 days. 5 tablet 0   . rOPINIRole (REQUIP) 2 MG tablet Take 2 mg by mouth at bedtime.   Not Taking  . sildenafil (VIAGRA) 100 MG tablet Take 1 tablet (100 mg total) by mouth as needed for erectile dysfunction (for use prior to sexual activity). 30 tablet 0 Not Taking  . traMADol (ULTRAM) 50 MG tablet Take 1 tablet (50 mg total) by mouth every 6 (six) hours as needed. 15 tablet 0   . venlafaxine XR (EFFEXOR-XR) 150 MG 24 hr capsule Take 1 capsule (150 mg total) by mouth daily. 30 capsule 0     Previous Psychotropic Medications: Yes   Substance Abuse History in the last 12 months:  Yes.      Consequences of Substance Abuse: Hospitalization and mood destabilization  Results for orders placed or performed during the hospital encounter of 06/25/15 (from the past 72 hour(s))  Urine rapid drug screen (hosp performed)     Status: None   Collection Time: 06/25/15  7:53 AM  Result Value Ref Range   Opiates NONE DETECTED NONE DETECTED   Cocaine NONE DETECTED NONE DETECTED   Benzodiazepines NONE DETECTED NONE DETECTED   Amphetamines NONE DETECTED NONE DETECTED   Tetrahydrocannabinol NONE DETECTED NONE DETECTED   Barbiturates NONE DETECTED NONE DETECTED    Comment:        DRUG SCREEN FOR MEDICAL PURPOSES ONLY.  IF CONFIRMATION IS NEEDED FOR ANY PURPOSE, NOTIFY LAB WITHIN 5 DAYS.        LOWEST DETECTABLE LIMITS FOR URINE DRUG SCREEN Drug Class       Cutoff (ng/mL) Amphetamine      1000 Barbiturate      200 Benzodiazepine   333 Tricyclics       545 Opiates          300 Cocaine          300 THC              50   Ethanol     Status: Abnormal   Collection Time: 06/25/15  8:41 AM  Result Value  Ref Range   Alcohol, Ethyl (B) 69 (H) <5 mg/dL    Comment:        LOWEST DETECTABLE LIMIT FOR SERUM ALCOHOL IS 5 mg/dL FOR MEDICAL PURPOSES ONLY   Comprehensive metabolic panel     Status: Abnormal   Collection Time: 06/25/15  8:41 AM   Result Value Ref Range   Sodium 136 135 - 145 mmol/L   Potassium 4.3 3.5 - 5.1 mmol/L   Chloride 98 (L) 101 - 111 mmol/L   CO2 26 22 - 32 mmol/L   Glucose, Bld 94 65 - 99 mg/dL   BUN 9 6 - 20 mg/dL   Creatinine, Ser 0.63 0.61 - 1.24 mg/dL   Calcium 8.8 (L) 8.9 - 10.3 mg/dL   Total Protein 6.8 6.5 - 8.1 g/dL   Albumin 3.9 3.5 - 5.0 g/dL   AST 211 (H) 15 - 41 U/L   ALT 140 (H) 17 - 63 U/L   Alkaline Phosphatase 98 38 - 126 U/L   Total Bilirubin 0.7 0.3 - 1.2 mg/dL   GFR calc non Af Amer >60 >60 mL/min   GFR calc Af Amer >60 >60 mL/min    Comment: (NOTE) The eGFR has been calculated using the CKD EPI equation. This calculation has not been validated in all clinical situations. eGFR's persistently <60 mL/min signify possible Chronic Kidney Disease.    Anion gap 12 5 - 15  CBC with Differential/Platelet     Status: Abnormal   Collection Time: 06/25/15  8:41 AM  Result Value Ref Range   WBC 3.7 (L) 4.0 - 10.5 K/uL   RBC 4.01 (L) 4.22 - 5.81 MIL/uL   Hemoglobin 14.2 13.0 - 17.0 g/dL   HCT 41.4 39.0 - 52.0 %   MCV 103.2 (H) 78.0 - 100.0 fL   MCH 35.4 (H) 26.0 - 34.0 pg   MCHC 34.3 30.0 - 36.0 g/dL   RDW 13.5 11.5 - 15.5 %   Platelets 104 (L) 150 - 400 K/uL    Comment: SPECIMEN CHECKED FOR CLOTS PLATELET COUNT CONFIRMED BY SMEAR    Neutrophils Relative % 56 43 - 77 %   Neutro Abs 2.1 1.7 - 7.7 K/uL   Lymphocytes Relative 27 12 - 46 %   Lymphs Abs 1.0 0.7 - 4.0 K/uL   Monocytes Relative 15 (H) 3 - 12 %   Monocytes Absolute 0.6 0.1 - 1.0 K/uL   Eosinophils Relative 1 0 - 5 %   Eosinophils Absolute 0.0 0.0 - 0.7 K/uL   Basophils Relative 1 0 - 1 %   Basophils Absolute 0.0 0.0 - 0.1 K/uL    Observation Level/Precautions:  15 minute checks  Laboratory:  Labs resulted, reviewed, and stable at this time.   Psychotherapy:  Group therapy, individual therapy, psychoeducation  Medications:  See MAR above  Consultations: None    Discharge Concerns: None    Estimated LOS: 5-7  days  Other:  N/A   Psychological Evaluations: Yes   Treatment Plan Summary: Alcohol use disorder, severe, dependence, unstable, managed as below:  Daily contact with patient to assess and evaluate symptoms and progress in treatment and Medication management   Medication: -Continue Neurontin 371m tid for neuropathy -Continue Mobic 1631mdaily for chronic pain -Continue Requip 31m43mhs for restless leg -Continue Effexor-XR 150m58mily for mood stabilization -Continue Ativan 1mg 64m16h prn for CIWA protocol -Start Lidoderm 5% to lower back for chronic pain  Medical Decision Making:  New problem, with additional work up  planned, Review of Psycho-Social Stressors (1), Review or order clinical lab tests (1), Review of Medication Regimen & Side Effects (2) and Review of New Medication or Change in Dosage (2)  I certify that inpatient services furnished can reasonably be expected to improve the patient's condition.   Benjamine Mola, [place] 8/7/20169:26 AM Patient seen face-to-face for psychiatric evaluation, chart reviewed and case discussed with the physician extender and developed treatment plan. Reviewed the information documented and agree with the treatment plan. Corena Pilgrim, MD

## 2015-06-26 NOTE — Progress Notes (Signed)
D: Pt reports having body aches when asked about his current withdrawal symptoms. Pt was also complaining of restless legs. Pt was restarted on his Requip. Pt was visible within the milieu but with minimal interactions with others. Pt is currently negative for any SI/HI/AVH. A: Writer administered scheduled and prn medications to pt, per MD . Continued support and availability as needed was extended to this pt. Staff continue to monitor pt with q29min checks.  R: No adverse drug reactions noted. Pt receptive to treatment. Pt remains safe at this time.

## 2015-06-26 NOTE — Tx Team (Addendum)
Initial Interdisciplinary Treatment Plan   PATIENT STRESSORS: Substance abuse   PATIENT STRENGTHS: General fund of knowledge Motivation for treatment/growth Work skills   PROBLEM LIST: Problem List/Patient Goals Date to be addressed Date deferred Reason deferred Estimated date of resolution  "stop drinking" (He states he is here to stop drinking so he doesn't die) 06/25/15   D/C  Increased risk for SI 06/25/15     anxiety 06/25/15                                          DISCHARGE CRITERIA:  Improved stabilization in mood, thinking, and/or behavior Motivation to continue treatment in a less acute level of care Need for constant or close observation no longer present Withdrawal symptoms are absent or subacute and managed without 24-hour nursing intervention  PRELIMINARY DISCHARGE PLAN: Attend 12-step recovery group Outpatient therapy  PATIENT/FAMIILY INVOLVEMENT: This treatment plan has been presented to and reviewed with the patient, Jimmy Barron.  The patient and family have been given the opportunity to ask questions and make suggestions.  Katia Hannen A *information derived from admitting data. 06/26/2015, 4:43 AM

## 2015-06-26 NOTE — Progress Notes (Signed)
Patient did attend the last half of the evening speaker AA meeting. Pt was notified that group was beginning but remained in bed for the first half.

## 2015-06-26 NOTE — Progress Notes (Signed)
D:  Patient's self inventory sheet, patient has fair sleep, no sleep medication given.  Poor appetite, low energy level, poor concentration.  Rated depression, hopeless, anxiety 10.  Withdrawals, tremors, cramping, nausea.  Denied SI.  Pain, aching all over.  Physical pain, worst in past 24 hours #9.  Goal is to feel better and go to groups.  No discharge plans. A:  Medications administered per MD orders.  Emotional support and encouragement given patient. R:  Denied SI and HI, contracts for safety.  Denied A/V hallucinations.  Safety maintained with 15 minute checks.

## 2015-06-26 NOTE — BHH Counselor (Signed)
Adult Comprehensive Assessment  Patient ID: Jimmy Barron, male   DOB: July 23, 1966, 49 y.o.   MRN: [id number]  Information Source: Information source: Patient  Current Stressors:  Educational / Learning stressors: 9th grad edu Employment / Job issues: On Disability Family Relationships: Clinical cytogeneticist / Lack of resources (include bankruptcy): Strained Housing / Lack of housing: Homeless Physical health (include injuries & life threatening diseases): 7 lb weight loss in 2 weeks Social relationships: "they all drink" Substance abuse: Ongoing Bereavement / Loss: NA  Living/Environment/Situation:  Living Arrangements: Other (Comment) (Homeless) Living conditions (as described by patient or guardian): Pt staying in motel in exchange for doing work there reports the work ran out and now he is basically homeless How long has patient lived in current situation?: 8 months What is atmosphere in current home: Temporary  Family History:  Does patient have children?: Yes How many children?: 1 How is patient's relationship with their children?: Pt reports immense pride in 45 YO 6'7" son who is joining [employer] and wishes his son could be proud of him.  Childhood History:  By whom was/is the patient raised?: Both parents Additional childhood history information: Father was alcoholic Description of patient's relationship with caregiver when they were a child: Good with both Patient's description of current relationship with people who raised him/her: Both deceased Does patient have siblings?: Yes Number of Siblings: 3 Description of patient's current relationship with siblings: Reports phone contact pretty regularly Did patient suffer any verbal/emotional/physical/sexual abuse as a child?: No Did patient suffer from severe childhood neglect?: No Has patient ever been sexually abused/assaulted/raped as an adolescent or adult?: No Was the patient ever a victim of a crime or a disaster?:  No Witnessed domestic violence?: No Has patient been effected by domestic violence as an adult?: No  Education:  Highest grade of school patient has completed: 10 Currently a student?: No Learning disability?: No  Employment/Work Situation:   Employment situation: On disability Why is patient on disability: Two rods in legs from where he injured himself in suicide attempt How long has patient been on disability: 10 years Patient's job has been impacted by current illness: No What is the longest time patient has a held a job?: 13 years  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, fashion/clothing Has patient ever been in the [employer]?: No Has patient ever served in Buyer, retail?: No  Financial Resources:   Surveyor, quantity resources: Insurance claims handler, Medicaid, Medicare Does patient have a Lawyer or guardian?: No  Alcohol/Substance Abuse:   What has been your use of drugs/alcohol within the last 12 months?: 8-10 40 oz beers daily for years; cocaine occassionally Alcohol/Substance Abuse Treatment Hx: Past Tx, Inpatient, Past detox, Past Tx, Outpatient If yes, describe treatment: Pt reported "probably 12 or 15 times in [place]" Has alcohol/substance abuse ever caused legal problems?: No  Social Support System:   Forensic psychologist System: Poor Describe Community Support System: Son Type of faith/religion: "Belief in God" How does patient's faith help to cope with current illness?: "Not really"  Leisure/Recreation:   Leisure and Hobbies: Survival  Strengths/Needs:   What things does the patient do well?: Survive In what areas does patient struggle / problems for patient: Alcohol, income, support and past infidelity of his ex wife  Discharge Plan:   Does patient have access to transportation?: No Plan for no access to transportation at discharge: Bus pass; ride to [place] Will patient be returning to same living situation after  discharge?: No Plan for living situation after discharge: Wants to go to [facility] where he was successful in past Currently receiving community mental health services: No If no, would patient like referral for services when discharged?: Yes (What county?) [facility]) Does patient have financial barriers related to discharge medications?: No  Summary/Recommendations:   Summary and Recommendations (to be completed by the evaluator): Pt is 49 YO divorced caucasian male admitted with diagnosis of Aclohol Abuse and Depressive Disorder NOS after requesting detox at [facility]. He states he has been drinking 8-10 40 oz beers per day since about last December. He also occasionally uses cocaine, last use 1 week ago. Pt also states that he was staying in a hotel, but is currently homeless and has no family supports. He endorses depressive symptoms with suicidal ideations off and on with not specific plan or intent. He also states that he chronically off an on "hears voices of people behind him, but they are not there, and i look like a fool". Pt has a history of several suicide attempts over the years including one about 9 years ago in which he jumped off a bridge and now has metal rods in his legs. He states he has had several pyschiatric admissions over the years.  Patient would benefit from crisis stabilization, medication evaluation, therapy groups for processing thoughts/feelings/experiences, psycho ed groups for increasing coping skills, and aftercare planning. Discharge Process and Patient Expectations information sheet signed by patient, witnessed by writer and inserted in patient's shadow chart. He declined referral to [facility]   [place], Jimmy Barron. 06/26/2015

## 2015-06-26 NOTE — BHH Suicide Risk Assessment (Signed)
[facility] INPATIENT:  Family/Significant Other Suicide Prevention Education  Suicide Prevention Education:  Patient Refusal for Family/Significant Other Suicide Prevention Education: The patient Jimmy Barron has refused to provide written consent for family/significant other to be provided Family/Significant Other Suicide Prevention Education during admission and/or prior to discharge.  Physician notified.  Writer provided suicide prevention education directly to patient; conversation included risk factors, warning signs and resources to contact for help. Mobile crisis services explained and  explanations given as to resources which will be included on patient's discharge paperwork.    Clide Dales 06/26/2015, 11:25 AM

## 2015-06-26 NOTE — BHH Suicide Risk Assessment (Signed)
[facility] Admission Suicide Risk Assessment   Nursing information obtained from:  Patient Demographic factors:  Male, Divorced or widowed, Caucasian, Low socioeconomic status, Living alone Current Mental Status:  NA Loss Factors:  NA Historical Factors:  Prior suicide attempts, Family history of mental illness or substance abuse Risk Reduction Factors:  Employed Total Time spent with patient: 30 minutes Principal Problem: Alcohol use disorder, severe, dependence Diagnosis:   Patient Active Problem List   Diagnosis Date Noted  . MDD (major depressive disorder), recurrent, severe, with psychosis [F33.3] 06/26/2015    Priority: High  . Alcohol use disorder, severe, dependence [F10.20] 06/25/2015    Priority: High  . Depression [F32.9] 03/13/2013  . Testicular pain [N50.8] 07/18/2012  . Erectile dysfunction [N52.9] 07/18/2012  . Chronic pain in the left leg status post femur fracture and ORIF [G89.29] 07/18/2012  . Preventive measure [Z41.8] 07/18/2012  . Alcoholism [F10.20]   . Restless legs syndrome (RLS) [G25.81]      Continued Clinical Symptoms:  Alcohol Use Disorder Identification Test Final Score (AUDIT): 33 The "Alcohol Use Disorders Identification Test", Guidelines for Use in Primary Care, Second Edition.  World Science writer [facility]). Score between 0-7:  no or low risk or alcohol related problems. Score between 8-15:  moderate risk of alcohol related problems. Score between 16-19:  high risk of alcohol related problems. Score 20 or above:  warrants further diagnostic evaluation for alcohol dependence and treatment.   CLINICAL FACTORS:   Severe Anxiety and/or Agitation Depression:   Anhedonia Comorbid alcohol abuse/dependence Hopelessness Impulsivity Insomnia Severe Alcohol/Substance Abuse/Dependencies Chronic Pain Previous Psychiatric Diagnoses and Treatments   Musculoskeletal: Strength & Muscle Tone: within normal limits Gait & Station: normal Patient leans:  N/A  Psychiatric Specialty Exam: Physical Exam  Psychiatric: His speech is delayed. He is withdrawn. Cognition and memory are normal. He expresses impulsivity. He exhibits a depressed mood. He expresses suicidal ideation.    Review of Systems  HENT: Negative.   Eyes: Negative.   Respiratory: Negative.   Cardiovascular: Negative.   Gastrointestinal: Positive for nausea.  Genitourinary: Negative.   Musculoskeletal: Positive for joint pain.  Skin: Negative.   Neurological: Positive for tremors and weakness.  Endo/Heme/Allergies: Negative.   Psychiatric/Behavioral: Positive for depression, suicidal ideas and substance abuse. The patient is nervous/anxious and has insomnia.     Blood pressure 96/69, pulse 87, temperature 98 F (36.7 C), temperature source Oral, resp. rate 19.There is no weight on file to calculate BMI.  General Appearance: Disheveled  Eye Contact::  Minimal  Speech:  Slow  Volume:  Decreased  Mood:  Anxious, Depressed, Dysphoric and Hopeless  Affect:  Constricted  Thought Process:  Disorganized  Orientation:  Full (Time, Place, and Person)  Thought Content:  Rumination  Suicidal Thoughts:  Yes.  without intent/plan  Homicidal Thoughts:  No  Memory:  Immediate;   Fair Recent;   Fair Remote;   Fair  Judgement:  Impaired  Insight:  Lacking  Psychomotor Activity:  Decreased  Concentration:  Fair  Recall:  [employer] of Knowledge:Fair  Language: Good  Akathisia:  No  Handed:  Right  AIMS (if indicated):     Assets:  Communication Skills Desire for Improvement  Sleep:  Number of Hours: 6.75  Cognition: WNL  ADL's:  Intact     COGNITIVE FEATURES THAT CONTRIBUTE TO RISK:  Closed-mindedness and Polarized thinking    SUICIDE RISK:   Mild:  Suicidal ideation of limited frequency, intensity, duration, and specificity.  There are no identifiable  plans, no associated intent, mild dysphoria and related symptoms, good self-control (both objective and subjective  assessment), few other risk factors, and identifiable protective factors, including available and accessible social support.  PLAN OF CARE: 1. Admit for crisis management and stabilization. 2. Medication management to reduce current symptoms to base line and improve the patient's overall level of functioning 3. Treat health problems as indicated. 4. Develop treatment plan to decrease risk of relapse upon discharge and the need for readmission. 5. Psycho-social education regarding relapse prevention and self care. 6. Health care follow up as needed for medical problems. 7. Restart home medications where appropriate.   Medical Decision Making:  Review or order clinical lab tests (1), Established Problem, Worsening (2), Review of Medication Regimen & Side Effects (2) and Review of New Medication or Change in Dosage (2)  I certify that inpatient services furnished can reasonably be expected to improve the patient's condition.   Thedore Mins, MD 06/26/2015, 10:40 AM

## 2015-06-27 MED ORDER — LORAZEPAM 1 MG PO TABS: 1.0000 mg | ORAL_TABLET | Freq: Every day | ORAL | Status: DC

## 2015-06-27 MED ORDER — LORAZEPAM 1 MG PO TABS: 1.0000 mg | ORAL_TABLET | Freq: Four times a day (QID) | ORAL | Status: DC | PRN

## 2015-06-27 MED ORDER — LORAZEPAM 1 MG PO TABS
1.0000 mg | ORAL_TABLET | Freq: Four times a day (QID) | ORAL | Status: AC
  Administered 2015-06-27 (×2): 1 mg via ORAL
  Filled 2015-06-27: qty 1

## 2015-06-27 MED ORDER — LORAZEPAM 1 MG PO TABS
1.0000 mg | ORAL_TABLET | Freq: Three times a day (TID) | ORAL | Status: AC
  Administered 2015-06-28 (×3): 1 mg via ORAL
  Filled 2015-06-27 (×5): qty 1

## 2015-06-27 MED ORDER — HALOPERIDOL 5 MG PO TABS
5.0000 mg | ORAL_TABLET | Freq: Once | ORAL | Status: AC
  Administered 2015-06-27: 5 mg via ORAL
  Filled 2015-06-27 (×2): qty 1

## 2015-06-27 MED ORDER — LORAZEPAM 1 MG PO TABS
1.0000 mg | ORAL_TABLET | Freq: Two times a day (BID) | ORAL | Status: DC
  Administered 2015-06-29: 1 mg via ORAL
  Filled 2015-06-27: qty 1

## 2015-06-27 NOTE — Progress Notes (Signed)
Adult Psychoeducational Group Note  Date:  06/27/2015 Time:  8:55 PM  Group Topic/Focus:  Wrap-Up Group:   The focus of this group is to help patients review their daily goal of treatment and discuss progress on daily workbooks.  Participation Level:  Minimal  Participation Quality:  Inattentive and Redirectable  Affect:  Not Congruent  Cognitive:  Disorganized and Confused  Insight: Limited  Engagement in Group:  Lacking and Off Topic  Modes of Intervention:  Discussion  Additional Comments:  Patient was present in group but could not fully comprehend the questions I was asking him. He was disoriented and in another state of mind.  Natasha Mead 06/27/2015, 8:55 PM

## 2015-06-27 NOTE — Progress Notes (Signed)
[facility] MD Progress Note  06/27/2015 4:02 PM Jimmy Barron  MRN:  [id number] Subjective:  Jimmy Barron states that he is still having it "rough." states he is still having shakes, sweats and that he is not being able to keep food. He has been drinking up to Saturday.. Denies having been taking any psychotropics.  Principal Problem: Alcohol use disorder, severe, dependence Diagnosis:   Patient Active Problem List   Diagnosis Date Noted  . MDD (major depressive disorder), recurrent, severe, with psychosis [F33.3] 06/26/2015  . Alcohol use disorder, severe, dependence [F10.20] 06/25/2015  . Depression [F32.9] 03/13/2013  . Testicular pain [N50.8] 07/18/2012  . Erectile dysfunction [N52.9] 07/18/2012  . Chronic pain in the left leg status post femur fracture and ORIF [G89.29] 07/18/2012  . Preventive measure [Z41.8] 07/18/2012  . Alcoholism [F10.20]   . Restless legs syndrome (RLS) [G25.81]    Total Time spent with patient: 30 minutes   Past Medical History:  Past Medical History  Diagnosis Date  . Alcoholism   . Depression   . Restless legs syndrome (RLS)   . Chronic back pain   . Sciatica   . Chronic leg pain     left    Past Surgical History  Procedure Laterality Date  . Leg surgery      left   Family History:  Family History  Problem Relation Age of Onset  . Alcohol abuse Father   . Cancer Father     lung  . Alzheimer's disease Mother    Social History:  History  Alcohol Use  . Yes    Comment: daily- pt reports "drinks all day."     History  Drug Use No    History   Social History  . Marital Status: Divorced    Spouse Name: N/A  . Number of Children: N/A  . Years of Education: N/A   Social History Main Topics  . Smoking status: Current Every Day Smoker -- 1.00 packs/day for 30 years    Types: Cigarettes  . Smokeless tobacco: Former Neurosurgeon    Types: Chew  . Alcohol Use: Yes     Comment: daily- pt reports "drinks all day."  . Drug Use: No  . Sexual Activity: Not  Currently   Other Topics Concern  . None   Social History Narrative   Additional History:    Sleep: Poor  Appetite:  Poor   Assessment:   Musculoskeletal: Strength & Muscle Tone: within normal limits Gait & Station: normal Patient leans: normal   Psychiatric Specialty Exam: Physical Exam  Review of Systems  Constitutional: Negative.   HENT: Negative.   Eyes: Negative.   Respiratory: Negative.   Cardiovascular: Negative.   Gastrointestinal: Negative.   Genitourinary: Negative.   Musculoskeletal: Negative.   Skin: Negative.   Neurological: Negative.   Endo/Heme/Allergies: Negative.   Psychiatric/Behavioral: Positive for depression and substance abuse. The patient is nervous/anxious and has insomnia.     Blood pressure 107/78, pulse 61, temperature 97.9 F (36.6 C), temperature source Oral, resp. rate 20.There is no weight on file to calculate BMI.  General Appearance: Disheveled  Eye Solicitor::  Fair  Speech:  Clear and Coherent, Slow and not spontaneous  Volume:  Decreased  Mood:  Anxious and Depressed  Affect:  Restricted  Thought Process:  Coherent and Goal Directed  Orientation:  Full (Time, Place, and Person)  Thought Content:  symptoms events worries concerns  Suicidal Thoughts:  No  Homicidal Thoughts:  No  Memory:  Immediate;  Fair Recent;   Fair Remote;   Fair  Judgement:  Fair  Insight:  Shallow  Psychomotor Activity:  Restlessness  Concentration:  Fair  Recall:  [employer] of Knowledge:Fair  Language: Fair  Akathisia:  No  Handed:  Right  AIMS (if indicated):     Assets:  Desire for Improvement  ADL's:  Intact  Cognition: WNL  Sleep:  Number of Hours: 2.25     Current Medications: Current Facility-Administered Medications  Medication Dose Route Frequency Provider Last Rate Last Dose  . alum & mag hydroxide-simeth (MAALOX/MYLANTA) 200-200-20 MG/5ML suspension 30 mL  30 mL Oral Q4H PRN Beau Fanny, FNP      . feeding supplement  (ENSURE ENLIVE) (ENSURE ENLIVE) liquid 237 mL  237 mL Oral BID BM Beau Fanny, FNP   237 mL at 06/25/15 1534  . gabapentin (NEURONTIN) capsule 300 mg  300 mg Oral TID Beau Fanny, FNP   300 mg at 06/27/15 1200  . hydrOXYzine (ATARAX/VISTARIL) tablet 25 mg  25 mg Oral Q6H PRN Beau Fanny, FNP   25 mg at 06/26/15 2112  . lidocaine (LIDODERM) 5 % 1 patch  1 patch Transdermal Daily Beau Fanny, FNP   1 patch at 06/26/15 1300  . loperamide (IMODIUM) capsule 2-4 mg  2-4 mg Oral PRN Beau Fanny, FNP      . LORazepam (ATIVAN) tablet 1 mg  1 mg Oral Q6H PRN Beau Fanny, FNP   1 mg at 06/26/15 0820  . LORazepam (ATIVAN) tablet 1 mg  1 mg Oral Q6H PRN Rachael Fee, MD      . LORazepam (ATIVAN) tablet 1 mg  1 mg Oral QID Rachael Fee, MD   1 mg at 06/27/15 1415   Followed by  . [START ON 06/28/2015] LORazepam (ATIVAN) tablet 1 mg  1 mg Oral TID Rachael Fee, MD       Followed by  . [START ON 06/29/2015] LORazepam (ATIVAN) tablet 1 mg  1 mg Oral BID Rachael Fee, MD       Followed by  . [START ON 06/30/2015] LORazepam (ATIVAN) tablet 1 mg  1 mg Oral Daily Rachael Fee, MD      . magnesium hydroxide (MILK OF MAGNESIA) suspension 30 mL  30 mL Oral Daily PRN Beau Fanny, FNP      . meloxicam [facility]) tablet 15 mg  15 mg Oral Daily Mojeed Akintayo   15 mg at 06/27/15 0917  . multivitamin with minerals tablet 1 tablet  1 tablet Oral Daily Beau Fanny, FNP   1 tablet at 06/27/15 [phone]  . ondansetron (ZOFRAN-ODT) disintegrating tablet 4 mg  4 mg Oral Q6H PRN Beau Fanny, FNP   4 mg at 06/26/15 2112  . rOPINIRole (REQUIP) tablet 2 mg  2 mg Oral QHS Beau Fanny, FNP   2 mg at 06/26/15 2112  . thiamine (B-1) injection 100 mg  100 mg Intramuscular Once Beau Fanny, FNP   100 mg at 06/25/15 1538  . thiamine (VITAMIN B-1) tablet 100 mg  100 mg Oral Daily Beau Fanny, FNP   100 mg at 06/27/15 0915  . venlafaxine XR (EFFEXOR-XR) 24 hr capsule 150 mg  150 mg Oral Daily Beau Fanny, FNP    150 mg at 06/27/15 9604    Lab Results: No results found for this or any previous visit (from the past 48 hour(s)).  Physical Findings: AIMS:  Facial and Oral Movements Muscles of Facial Expression: None, normal Lips and Perioral Area: None, normal Jaw: None, normal Tongue: None, normal,Extremity Movements Upper (arms, wrists, hands, fingers): None, normal Lower (legs, knees, ankles, toes): None, normal, Trunk Movements Neck, shoulders, hips: None, normal, Overall Severity Severity of abnormal movements (highest score from questions above): None, normal Incapacitation due to abnormal movements: None, normal Patient's awareness of abnormal movements (rate only patient's report): No Awareness, Dental Status Current problems with teeth and/or dentures?: No Does patient usually wear dentures?: No  CIWA:  CIWA-Ar Total: 3 COWS:  COWS Total Score: 7  Treatment Plan Summary: Daily contact with patient to assess and evaluate symptoms and progress in treatment and Medication management Supportive approach/coping skills Alcohol dependence; Ativan detox protocol/work a relapse prevention plan Depression; pursue the Effexor XR 150 mg further  Work with CBT/mindfulness  Medical Decision Making:  Review of Psycho-Social Stressors (1), Review of Medication Regimen & Side Effects (2) and Review of New Medication or Change in Dosage (2)     Prescott Truex A 06/27/2015, 4:02 PM

## 2015-06-27 NOTE — BHH Group Notes (Signed)
[facility] LCSW Aftercare Discharge Planning Group Note   06/27/2015 9:41 AM  Participation Quality:  DID NOT ATTEND. Pt chose to remain in room to sleep.   Smart, [employer]

## 2015-06-27 NOTE — Progress Notes (Addendum)
Patient on hallway at the beginning of the shift; He appeared drowsy, denied SI/HI and denied Hallucinations.He complaints of generalize weaknesses. According to report from the other patients on the hall; "patient seemed to be out of it". Writer assessed patient and offered patient  Large cup of Gatorade. Encouraged patient to lay down in his room and get some rest. Patient's medication profile evaluated. He received Ativan twice within short period and also received 300 mg of Neurontin which could be making him drowsy. Writer encouraged the tech on the hallway to pay extra attention to patient. V/S checked and it was within normal limit.  Writer encouraged patient to stay in his room and get some rest. Writer called PA on call and left message for him to call writer about patient's status. Q 15 minute check continues as ordered to maintain safety.  PA notified about patient's status. Advised Clinical research associate to hold HS medications since patient might be going through medication induced Delirium. He also encouraged staff to pay close attention to patient.   2150: Patient resting quietly with his eyes closed. Respirations even and unlabored. No distress noted at this time. Q 15 minute check continues as ordered for safety.   MHT reported that patient reported having visual hallucinations. Writer notified the PA. He ordered Haldol 5 mg, one time dose. Patient slept shortly after that and through the night.

## 2015-06-27 NOTE — Progress Notes (Signed)
D: Pt presented with more energy than his first day of admit. Pt continues to have ongoing nausea. No counts of emesis this shift. Pt was visible within the milieu but with minimal interactions with others. D: Pt is in affect and in mood. Pt rates her depression at a level out of ten and her anxiety as a . Pt attended group this evening. Pt observed interacting appropriately within the milieu.  A: Writer administered scheduled and prn medications to pt, per MD orders. Continued support and availability as needed was extended to this pt. Staff continue to monitor pt with q40min checks.  R: No adverse drug reactions noted. Pt receptive to treatment. Pt remains safe at this time.

## 2015-06-27 NOTE — Progress Notes (Signed)
D- Patient goes back and forth from feeling "miserable" and feeling "good" throughout shift.  When patient expresses feeling "miserable" he is observed laying in bed, with his lights turned off and states that he is unable to get out of bed.  When patient is feeling "good" he is observed in the milieu interacting well with others, attending groups, and going off the unit for meal times. He denies SI, HI, AVH, and pain.  Patient had c/o of anxiety but reports "feeling a lot better than yesterday".  Patient refuses his Ensure and states "I just don't want it". A- PRN and scheduled medications administered to patient, per MD orders. Support and encouragement provided.  Routine safety checks conducted every 15 minutes.  Patient informed to notify staff with problems or concerns. R- No adverse drug reactions noted. Patient contracts for safety at this time. Patient compliant with medications and treatment plan. Patient receptive, calm, and cooperative. Patient interacts well with others on the unit.  Patient remains safe at this time.

## 2015-06-27 NOTE — [facility] Group Notes (Signed)
[facility] LCSW Group Therapy  06/27/2015 2:21 PM  Type of Therapy:  Group Therapy  Participation Level:  Did Not Attend-pt experiencing severe withdrawals and chose to remain in bed this afternoon.   Summary of Progress/Problems: Today's Topic: Overcoming Obstacles. Patients identified one short term goal and potential obstacles in reaching this goal. Patients processed barriers involved in overcoming these obstacles. Patients identified steps necessary for overcoming these obstacles and explored motivation (internal and external) for facing these difficulties head on.   Smart, Aveya Beal LCSWA  06/27/2015, 2:21 PM

## 2015-06-27 NOTE — Progress Notes (Signed)
Patient ID: Jimmy Barron, male   DOB: 05/07/1966, 49 y.o.   MRN: [id number] PER STATE REGULATIONS 482.30  THIS CHART WAS REVIEWED FOR MEDICAL NECESSITY WITH RESPECT TO THE PATIENT'S ADMISSION/DURATION OF STAY.  NEXT REVIEW DATE:06/29/15  Loura Halt, RN, BSN CASE MANAGER

## 2015-06-27 NOTE — Clinical Social Work Note (Signed)
[facility] house application and clinicals faxed.  Trula Slade, LCSWA Clinical Social Worker 06/27/2015 3:33 PM

## 2015-06-27 NOTE — Tx Team (Signed)
Interdisciplinary Treatment Plan Update (Adult)  Date:  06/27/2015  Time Reviewed:  8:14 AM   Progress in Treatment: Attending groups: No. Participating in groups:  No. Taking medication as prescribed:  Yes. Tolerating medication:  Yes. Family/Significant othe contact made:  Completed with pt, did not allow consent for family contact.  Patient understands diagnosis:  Yes. and As evidenced by:  seeking treatment for ETOH detox, SI, depression, and AH.  Discussing patient identified problems/goals with staff:  Yes. Medical problems stabilized or resolved:  Yes. Denies suicidal/homicidal ideation: Yes. Issues/concerns per patient self-inventory:  Other:  Discharge Plan or Barriers: CSW assessing for appropriate referrals. Pt did not attend discharge planning group this morning.   Reason for Continuation of Hospitalization: Depression Medication stabilization Withdrawal symptoms  Comments:  Jimmy Barron is an 49 y.o. male who came to [facility] requesting alcohol detox. He states he has been drinking 8-10 40 oz beers per day since about last December. He also occasionally uses cocaine, last use 1 week ago. Pt also states that he was staying in a hotel, but is currently homeless and has no family supports. He endorses depressive symptoms with suicidal ideations off and on with not specific plan or intent. He also states that he chronically off an on "hears voices of people behind him, but they are not there, and i look like a fool". Pt has a history of several suicide attempts over the years including one about 9 years ago in which he jumped off a bridge and now has metal rods in his legs. He states he has had several pyschiatric admissions over the years.During assessment, pt was casually dressed, and cooperative, with depressed mood and sad affect. He denies HI, current SI, AVH and history of violence. There was no evidence of pt responding to internal stimuli. He was oriented to person, place and  situation, but did not know what day or month it is. Pt's movement, thought content and speech were normal. He states that he did get some treatment 9 months ago at [employer] [facility], but has no current Op providers.   Estimated length of stay:  3-5 days   Additional Comments:  Patient and CSW reviewed pt's identified goals and treatment plan. Patient verbalized understanding and agreed to treatment plan. CSW reviewed [facility] "Discharge Process and Patient Involvement" Form. Pt verbalized understanding of information provided and signed form.    Review of initial/current patient goals per problem list:  1. Goal(s): Patient will participate in aftercare plan  Met: No   Target date: at discharge  As evidenced by: Patient will participate within aftercare plan AEB aftercare provider and housing plan at discharge being identified.  8/8: Pt did not attend d/c planning group. Homeless. CSW assessing for appropriate referrals.   2. Goal (s): Patient will exhibit decreased depressive symptoms and suicidal ideations.  Met: No.    Target date: at discharge  As evidenced by: Patient will utilize self rating of depression at 3 or below and demonstrate decreased signs of depression or be deemed stable for discharge by MD.  8/8: Pt rates depression as high and reports no SI/HI/AVH today.   3. Goal(s): Patient will demonstrate decreased signs of withdrawal due to substance abuse  Met:No.   Target date:at discharge   As evidenced by: Patient will produce a CIWA/COWS score of 0, have stable vitals signs, and no symptoms of withdrawal.   8/8: Pt reports mild withdrawals with CIWA score of 2 and high sitting BP/low sitting pulse.  Attendees: Patient:   06/27/2015 8:14 AM   Family:   06/27/2015 8:14 AM   Physician:  Dr. Carlton Adam, MD 06/27/2015 8:14 AM   Nursing:   Maxine Glenn RN; Christa RN 06/27/2015 8:14 AM   Clinical Social Worker: Maxie Better, [place]  06/27/2015 8:14 AM   Clinical Social Worker:  Erasmo Downer Drinkard LCSWA; Peri Maris LCSWA 06/27/2015 8:14 AM   Other:  Gerline Legacy Nurse Case Manager 06/27/2015 8:14 AM   Other:  Lucinda Dell; [facility]  06/27/2015 8:14 AM   Other:   06/27/2015 8:14 AM   Other:  06/27/2015 8:14 AM   Other:  06/27/2015 8:14 AM   Other:  06/27/2015 8:14 AM    06/27/2015 8:14 AM    06/27/2015 8:14 AM    06/27/2015 8:14 AM    06/27/2015 8:14 AM    Scribe for Treatment Team:   Maxie Better, LCSWA  06/27/2015 8:14 AM

## 2015-06-27 NOTE — Progress Notes (Signed)
Recreation Therapy Notes  Date: 08.08.16 Time: 930 am Location: 300 Hall Group Room  Group Topic: Stress Management  Goal Area(s) Addresses:  Patient will verbalize importance of using healthy stress management.  Patient will identify positive emotions associated with healthy stress management.   Intervention: Stress Management  Activity : Guided Imagery Script. LRT will introduce and instruct patients on the stress management technique of guided imagery. Patients were asked to follow a long with a script read a loud by LRT to participate in the stress management technique of guided imagery.  Education: Stress Management, Discharge Planning.   Education Outcome: Acknowledges edcuation/In group clarification offered/Needs additional education  Clinical Observations/Feedback: Patient did not attend group.   Telma Pyeatt, LRT/CTRS         Shyhiem Beeney A 06/27/2015 4:29 PM 

## 2015-06-27 NOTE — Clinical Social Work Note (Signed)
CSW met with pt individually this morning. [employer] house application given to pt. CSW requested that MD order TB test (needed prior to admission).   Maxie Better, [employer] Clinical Social Worker 06/27/2015 10:59 AM

## 2015-06-28 LAB — AMMONIA: Ammonia: 29 umol/L (ref 9–35)

## 2015-06-28 MED ORDER — TRAZODONE HCL 50 MG PO TABS
50.0000 mg | ORAL_TABLET | Freq: Every evening | ORAL | Status: DC | PRN
  Filled 2015-06-28 (×2): qty 1
  Filled 2015-06-28 (×2): qty 28
  Filled 2015-06-28: qty 1

## 2015-06-28 NOTE — Progress Notes (Signed)
Pt did not attend evening AA speaker meeting. Pt was in his room resting.

## 2015-06-28 NOTE — [facility] Group Notes (Signed)
[facility] Group Notes: (Nursing/MHT/Case Management/Adjunct)  Date: 06/28/2015  Time: 10:24 AM  Type of Therapy: Group Therapy  Participation Level: Did Not Attend  Participation Quality: None  Affect: None  Cognitive: None  Insight: None  Engagement in Group: None-Did not attend  Modes of Intervention: Discussion and education  Summary of Progress/Problems: Patient invited to group and chose not to attend.  Daja Shuping V Lilyann Gravelle 06/28/2015, 10:24 AM 

## 2015-06-28 NOTE — Clinical Social Work Note (Signed)
CSW spoke with Lawanna Kobus (Armed forces technical officer) at [facility] this morning. Referral processing and under review. Lawanna Kobus will call by the end of the day with decision.  Trula Slade, LCSWA Clinical Social Worker 06/28/2015 8:52 AM

## 2015-06-28 NOTE — Progress Notes (Signed)
Patient appeared to be doing al lot better than yesterday. He denied audio/visual hallucinations. " I'm not seeing those things or hearing any voice any more, thank God!". Patient  Also denied withdrawal symptoms. His thought process is clear. His goal is to be discharged within one or two days. He reported that he plans to go to a treatment facility[facility]  in [place]. Writer encouraged and supported patient. Q 15 minute check continues a as ordered to maintain safety.

## 2015-06-28 NOTE — [facility] Group Notes (Signed)
[facility] LCSW Group Therapy  06/28/2015 12:48 PM  Type of Therapy:  Group Therapy  Participation Level:  Did Not Attend-pt chose to remain in room/resting.   Summary of Progress/Problems: MHA Speaker came to talk about his personal journey with substance abuse and addiction. The pt processed ways by which to relate to the speaker. MHA speaker provided handouts and educational information pertaining to groups and services offered by the [facility].   Smart, Shamara Soza LCSWA 06/28/2015, 12:48 PM

## 2015-06-28 NOTE — Progress Notes (Signed)
D- Patient is depressed.  His morning medications were held until the MD could be consulted about patient's condition overnight.  Patient had logical and coherent speech this shift.  Following MD consult, withdrawal medications were given to patient per MD order.  Denies SI, HI, and AVH. Patient has spells of drowsiness where he lays down in his bed to rest.  He enters the milieu minimally and does come out of the room for med pass and meal time.  When patient is in the milieu, he interacts well with others and is observed laughing and joking with staff and peers.  On patient's self-inventory sheet, he reports poor sleep last night and rates his depression "7", feelings of hopelessness "3" and his anxiety "10" with 10 being the worst.   A- Scheduled medications administered to patient, per MD orders. Support and encouragement provided.  Safety checks continued.  Patient was given a water jug and encouraged to drink plenty of fluids.   R- No adverse drug reactions noted. Patient contracts for safety at this time.  Patient remains safe at this time. 1712: Patient states that he is feeling "a whole lot better".  He is being intrusive and is making sexually inappropriate comments towards staff.

## 2015-06-28 NOTE — Progress Notes (Signed)
Recreation Therapy Notes  Animal-Assisted Activity (AAA) Program Checklist/Progress Notes Patient Eligibility Criteria Checklist & Daily Group note for Rec Tx Intervention  Date: 08.09.16 Time: 2:45 pm Location: 400 Hall Dayroom  AAA/T Program Assumption of Risk Form signed by Patient/ or Parent Legal Guardian yes  Patient is free of allergies or sever asthma yes  Patient reports no fear of animals yes  Patient reports no history of cruelty to animalsyes  Patient understands his/her participation is voluntary yes  Patient washes hands before animal contact yes  Patient washes hands after animal contact yes  Education: Hand Washing, Appropriate Animal Interaction   Education Outcome: Acknowledges understanding/In group clarification offered/Needs additional education.   Clinical Observations/Feedback: Patient did not attend group.   Jimmy Barron, LRT/CTRS         Jimmy Barron 06/28/2015 4:33 PM 

## 2015-06-28 NOTE — Progress Notes (Signed)
[facility] MD Progress Note  06/28/2015 5:26 PM Jimmy Barron  MRN:  [id number] Subjective:  Jimmy Barron had an episode of disorientation confusion last night. He seemed to have been hallucinating. His roommate describes that he was up and down all night at one time reaching out for paper and pencil in the dark. When assessed in the morning somewhat confused disoriented as to month day, oriented as to year. Did admit that last night he was reaching for a beer and thought he was at a bar.  Principal Problem: Alcohol use disorder, severe, dependence Diagnosis:   Patient Active Problem List   Diagnosis Date Noted  . MDD (major depressive disorder), recurrent, severe, with psychosis [F33.3] 06/26/2015    Priority: High  . Alcohol use disorder, severe, dependence [F10.20] 06/25/2015    Priority: High  . Depression [F32.9] 03/13/2013  . Testicular pain [N50.8] 07/18/2012  . Erectile dysfunction [N52.9] 07/18/2012  . Chronic pain in the left leg status post femur fracture and ORIF [G89.29] 07/18/2012  . Preventive measure [Z41.8] 07/18/2012  . Alcoholism [F10.20]   . Restless legs syndrome (RLS) [G25.81]    Total Time spent with patient: 30 minutes   Past Medical History:  Past Medical History  Diagnosis Date  . Alcoholism   . Depression   . Restless legs syndrome (RLS)   . Chronic back pain   . Sciatica   . Chronic leg pain     left    Past Surgical History  Procedure Laterality Date  . Leg surgery      left   Family History:  Family History  Problem Relation Age of Onset  . Alcohol abuse Father   . Cancer Father     lung  . Alzheimer's disease Mother    Social History:  History  Alcohol Use  . Yes    Comment: daily- pt reports "drinks all day."     History  Drug Use No    History   Social History  . Marital Status: Divorced    Spouse Name: N/A  . Number of Children: N/A  . Years of Education: N/A   Social History Main Topics  . Smoking status: Current Every Day Smoker --  1.00 packs/day for 30 years    Types: Cigarettes  . Smokeless tobacco: Former Neurosurgeon    Types: Chew  . Alcohol Use: Yes     Comment: daily- pt reports "drinks all day."  . Drug Use: No  . Sexual Activity: Not Currently   Other Topics Concern  . None   Social History Narrative   Additional History:    Sleep: Poor  Appetite:  Fair   Assessment:   Musculoskeletal: Strength & Muscle Tone: within normal limits Gait & Station: normal Patient leans: normal   Psychiatric Specialty Exam: Physical Exam  Review of Systems  Constitutional: Positive for malaise/fatigue.  HENT: Negative.   Eyes: Negative.   Respiratory: Negative.   Cardiovascular: Negative.   Gastrointestinal: Positive for nausea.  Genitourinary: Negative.   Musculoskeletal: Negative.   Skin: Negative.   Neurological: Positive for tremors.  Endo/Heme/Allergies: Negative.   Psychiatric/Behavioral: Positive for substance abuse. The patient is nervous/anxious.     Blood pressure 113/83, pulse 59, temperature 97.7 F (36.5 C), temperature source Oral, resp. rate 16.There is no weight on file to calculate BMI.  General Appearance: Disheveled  Eye Solicitor::  Fair  Speech:  Clear and Coherent  Volume:  Normal  Mood:  Anxious  Affect:  Labile  Thought Process:  Coherent and Goal Directed  Orientation:  Other:  to place, person, partially to time  Thought Content:  symptoms events worries concerns  Suicidal Thoughts:  No  Homicidal Thoughts:  No  Memory:  Immediate;   Fair Recent;   Fair Remote;   Fair  Judgement:  Fair  Insight:  Present and Shallow  Psychomotor Activity:  Restlessness  Concentration:  Fair  Recall:  [employer] of Knowledge:Poor  Language: Fair  Akathisia:  No  Handed:  Right  AIMS (if indicated):     Assets:  Desire for Improvement  ADL's:  Intact  Cognition: WNL  Sleep:  Number of Hours: 6.25     Current Medications: Current Facility-Administered Medications  Medication Dose  Route Frequency Provider Last Rate Last Dose  . alum & mag hydroxide-simeth (MAALOX/MYLANTA) 200-200-20 MG/5ML suspension 30 mL  30 mL Oral Q4H PRN Beau Fanny, FNP      . feeding supplement (ENSURE ENLIVE) (ENSURE ENLIVE) liquid 237 mL  237 mL Oral BID BM Beau Fanny, FNP   237 mL at 06/28/15 1423  . lidocaine (LIDODERM) 5 % 1 patch  1 patch Transdermal Daily Beau Fanny, FNP   1 patch at 06/26/15 1300  . LORazepam (ATIVAN) tablet 1 mg  1 mg Oral Q6H PRN Rachael Fee, MD      . Melene Muller ON 06/29/2015] LORazepam (ATIVAN) tablet 1 mg  1 mg Oral BID Rachael Fee, MD       Followed by  . [START ON 06/30/2015] LORazepam (ATIVAN) tablet 1 mg  1 mg Oral Daily Rachael Fee, MD      . magnesium hydroxide (MILK OF MAGNESIA) suspension 30 mL  30 mL Oral Daily PRN Beau Fanny, FNP      . meloxicam [facility]) tablet 15 mg  15 mg Oral Daily Mojeed Akintayo   Stopped at 06/28/15 0800  . multivitamin with minerals tablet 1 tablet  1 tablet Oral Daily Beau Fanny, FNP   Stopped at 06/28/15 0800  . rOPINIRole (REQUIP) tablet 2 mg  2 mg Oral QHS Beau Fanny, FNP   Stopped at 06/27/15 2329  . thiamine (B-1) injection 100 mg  100 mg Intramuscular Once Beau Fanny, FNP   100 mg at 06/25/15 1538  . thiamine (VITAMIN B-1) tablet 100 mg  100 mg Oral Daily Beau Fanny, FNP   100 mg at 06/28/15 1191    Lab Results: No results found for this or any previous visit (from the past 48 hour(s)).  Physical Findings: AIMS: Facial and Oral Movements Muscles of Facial Expression: None, normal Lips and Perioral Area: None, normal Jaw: None, normal Tongue: None, normal,Extremity Movements Upper (arms, wrists, hands, fingers): None, normal Lower (legs, knees, ankles, toes): None, normal, Trunk Movements Neck, shoulders, hips: None, normal, Overall Severity Severity of abnormal movements (highest score from questions above): None, normal Incapacitation due to abnormal movements: None, normal Patient's  awareness of abnormal movements (rate only patient's report): No Awareness, Dental Status Current problems with teeth and/or dentures?: No Does patient usually wear dentures?: No  CIWA:  CIWA-Ar Total: 6 COWS:  COWS Total Score: 7  Treatment Plan Summary: Daily contact with patient to assess and evaluate symptoms and progress in treatment and Medication management Supportive approach/coping skills Disorientation/confusion/distored perception; resolving/resolved. The Effexor was D/C and the Neurontin was held and his detox protocol was pursued further Ammonia level was ordered/hydration encouraged Will continue to monitor for any other changes in  mental status that would suggest he is having withdrawal delirium  Medical Decision Making:  Review of Psycho-Social Stressors (1), Review or order clinical lab tests (1), Review of Medication Regimen & Side Effects (2) and Review of New Medication or Change in Dosage (2)     Dijon Cosens A 06/28/2015, 5:26 PM

## 2015-06-29 MED ORDER — ROPINIROLE HCL 2 MG PO TABS: 2.0000 mg | ORAL_TABLET | Freq: Every day | ORAL | Status: DC

## 2015-06-29 MED ORDER — TRAZODONE HCL 50 MG PO TABS: 50.0000 mg | ORAL_TABLET | Freq: Every evening | ORAL | Status: DC | PRN

## 2015-06-29 NOTE — Progress Notes (Signed)
  [facility] Adult Case Management Discharge Plan :  Will you be returning to the same living situation after discharge:  Yes,  returning to hotel until bed becomes available at [facility] house At discharge, do you have transportation home?: Yes,  friend Do you have the ability to pay for your medications: Yes,  Medicare  Release of information consent forms completed and submitted to medical records by CSW.  Patient to Follow up at: Follow-up Information    Follow up with [facility].   Why:  Call [facility] in admissions at discharge to check status of referral.    Contact information:   [address], [place] [address] Phone: [phone] Fax: [phone]      Follow up with [facility].   Why:  Message left for [facility] at facility in attempt to schedule follow-up appt. Social worker will call you with appt time/date if not made prior to discharge.    Contact information:   [address] [place], [place] [address] Phone: [phone] Fax: [phone]      Patient denies SI/HI: Yes,  during group/self report.     Safety Planning and Suicide Prevention discussed: Yes,  SPE completed with pt, as he refused to consent to family contact. SPI pamphlet also provided to pt.   Have you used any form of tobacco in the last 30 days? (Cigarettes, Smokeless Tobacco, Cigars, and/or Pipes): Yes  Has patient been referred to the Quitline?: Patient refused referral  Jimmy Barron, Jimmy Barron  06/29/2015, 10:27 AM

## 2015-06-29 NOTE — BHH Group Notes (Signed)
[facility] LCSW Aftercare Discharge Planning Group Note   06/29/2015 8:47 AM  Participation Quality:  Appropriate   Mood/Affect:  Appropriate  Depression Rating:  0  Anxiety Rating:  High because I haven't slept.   Thoughts of Suicide:  No Will you contract for safety?   NA  Current AVH:  No  Plan for Discharge/Comments:  Pt plans to d/c today to [facility] near [place] and wait for bed to open up. Pt has followup in place at [facility]. No withdrawals reported.   Transportation Means: friend   Supports: friend   Counselling psychologist, Oncologist

## 2015-06-29 NOTE — BHH Suicide Risk Assessment (Signed)
[facility] Discharge Suicide Risk Assessment   Demographic Factors:  Male and Caucasian  Total Time spent with patient: 30 minutes  Musculoskeletal: Strength & Muscle Tone: within normal limits Gait & Station: normal Patient leans: normal  Psychiatric Specialty Exam: Physical Exam  Review of Systems  Constitutional: Negative.   HENT: Negative.   Eyes: Negative.   Respiratory: Negative.   Cardiovascular: Negative.   Gastrointestinal: Negative.   Genitourinary: Negative.   Musculoskeletal: Negative.   Skin: Negative.   Neurological: Negative.   Endo/Heme/Allergies: Negative.   Psychiatric/Behavioral: Positive for substance abuse.    Blood pressure 109/77, pulse 68, temperature 97.5 F (36.4 C), temperature source Oral, resp. rate 16, height 6\' 2"  (1.88 m), weight 77.111 kg (170 lb).Body mass index is 21.82 kg/(m^2).  General Appearance: Fairly Groomed  Patent attorney::  Fair  Speech:  Clear and Coherent409  Volume:  Normal  Mood:  Euthymic  Affect:  Appropriate  Thought Process:  Coherent and Goal Directed  Orientation:  Full (Time, Place, and Person)  Thought Content:  plans as he moves on, relapse prevention plan  Suicidal Thoughts:  No  Homicidal Thoughts:  No  Memory:  Immediate;   Fair Recent;   Fair Remote;   Fair  Judgement:  Fair  Insight:  Present  Psychomotor Activity:  Normal  Concentration:  Fair  Recall:  [employer] of Knowledge:Fair  Language: Fair  Akathisia:  No  Handed:  Right  AIMS (if indicated):     Assets:  Desire for Improvement  Sleep:  Number of Hours: 3.75  Cognition: WNL  ADL's:  Intact   Have you used any form of tobacco in the last 30 days? (Cigarettes, Smokeless Tobacco, Cigars, and/or Pipes): Yes  Has this patient used any form of tobacco in the last 30 days? (Cigarettes, Smokeless Tobacco, Cigars, and/or Pipes) Yes, Prescription not provided because: nicotine patches will be provided  Mental Status Per Nursing Assessment::   On  Admission:  NA  Current Mental Status by Physician: In full contact with reality. States he feels ready to be D/C. He is planning to get into the [facility] house. He states he is tired of the drinking. There are no active S/S of withdrawal. There are no active SI plans or intent. He is going to communicate with his son and sisters and explain what has been going on. States he hopes they understand. He states he is committed to abstinence.    Loss Factors: NA  Historical Factors: NA  Risk Reduction Factors:   Sense of responsibility to family and Positive social support  Continued Clinical Symptoms:  Depression:   Comorbid alcohol abuse/dependence Alcohol/Substance Abuse/Dependencies  Cognitive Features That Contribute To Risk:  Closed-mindedness, Polarized thinking and Thought constriction (tunnel vision)    Suicide Risk:  Minimal: No identifiable suicidal ideation.  Patients presenting with no risk factors but with morbid ruminations; may be classified as minimal risk based on the severity of the depressive symptoms  Principal Problem: Alcohol use disorder, severe, dependence Discharge Diagnoses:  Patient Active Problem List   Diagnosis Date Noted  . MDD (major depressive disorder), recurrent, severe, with psychosis [F33.3] 06/26/2015    Priority: High  . Alcohol use disorder, severe, dependence [F10.20] 06/25/2015    Priority: High  . Depression [F32.9] 03/13/2013  . Testicular pain [N50.8] 07/18/2012  . Erectile dysfunction [N52.9] 07/18/2012  . Chronic pain in the left leg status post femur fracture and ORIF [G89.29] 07/18/2012  . Preventive measure [Z41.8] 07/18/2012  .  Alcoholism [F10.20]   . Restless legs syndrome (RLS) [G25.81]     Follow-up Information    Follow up with [facility].   Why:  Call [facility] in admissions at discharge to check status of referral.    Contact information:   [address] [place] [address] Phone: [phone] Fax: [phone]       Follow up with [facility] On 07/01/2015.   Why:  Appt for hospital follow-up/medication management on this date at 8:00AM. If you are a new client, please bring photo ID, social security card, proof of income/disability check, and Medicare card.    Contact information:   [address] [place], [place] [address] Phone: [phone] Fax: [phone]      Plan Of Care/Follow-up recommendations:  Activity:  as tolerated Diet:  regular Follow up as above Is patient on multiple antipsychotic therapies at discharge:  No   Has Patient had three or more failed trials of antipsychotic monotherapy by history:  No  Recommended Plan for Multiple Antipsychotic Therapies: NA    Savahanna Almendariz A 06/29/2015, 1:16 PM

## 2015-06-29 NOTE — Clinical Social Work Note (Signed)
Per Dr. Dub Mikes, pt is having trouble finding transportation to hotel in [place]. [place] will pick up pt after 2:30PM to take him back to [facility]. From there, pt has been instructed to find transportation to his hotel. CWS assessing.   Trula Slade, LCSWA Clinical Social Worker 06/29/2015 12:48 PM

## 2015-06-29 NOTE — Progress Notes (Signed)
D/C instructions/meds/follow-up appointments reviewed, pt verbalized understanding, pt's belongings returned to pt, samples given, denies SI/HI/AVH 

## 2015-06-29 NOTE — Tx Team (Signed)
Interdisciplinary Treatment Plan Update (Adult)  Date:  06/29/2015  Time Reviewed:  10:23 AM   Progress in Treatment: Attending groups: Intermittently  Participating in groups:  Yes, when he attends  Taking medication as prescribed:  Yes. Tolerating medication:  Yes. Family/Significant othe contact made:  Completed with pt, did not allow consent for family contact.  Patient understands diagnosis:  Yes. and As evidenced by:  seeking treatment for ETOH detox, SI, depression, and AH.  Discussing patient identified problems/goals with staff:  Yes. Medical problems stabilized or resolved:  Yes. Denies suicidal/homicidal ideation: Yes. Issues/concerns per patient self-inventory:  Other:  Discharge Plan or Barriers: Pt has referral pending at [facility] and followup appt at [facility] in place. He wants to stay at [facility] near [place] at d/c and has a friend coming to pick him up today.   Reason for Continuation of Hospitalization: None   Comments:  Jimmy Barron is an 49 y.o. male who came to [facility] requesting alcohol detox. He states he has been drinking 8-10 40 oz beers per day since about last December. He also occasionally uses cocaine, last use 1 week ago. Pt also states that he was staying in a hotel, but is currently homeless and has no family supports. He endorses depressive symptoms with suicidal ideations off and on with not specific plan or intent. He also states that he chronically off an on "hears voices of people behind him, but they are not there, and i look like a fool". Pt has a history of several suicide attempts over the years including one about 9 years ago in which he jumped off a bridge and now has metal rods in his legs. He states he has had several pyschiatric admissions over the years.During assessment, pt was casually dressed, and cooperative, with depressed mood and sad affect. He denies HI, current SI, AVH and history of violence. There was no  evidence of pt responding to internal stimuli. He was oriented to person, place and situation, but did not know what day or month it is. Pt's movement, thought content and speech were normal. He states that he did get some treatment 9 months ago at [facility], but has no current Op providers.   Estimated length of stay:  D/c today   Additional Comments:  Patient and CSW reviewed pt's identified goals and treatment plan. Patient verbalized understanding and agreed to treatment plan. CSW reviewed [facility] "Discharge Process and Patient Involvement" Form. Pt verbalized understanding of information provided and signed form.    Review of initial/current patient goals per problem list:  1. Goal(s): Patient will participate in aftercare plan  Met: Yes  Target date: at discharge  As evidenced by: Patient will participate within aftercare plan AEB aftercare provider and housing plan at discharge being identified.  8/8: Pt did not attend d/c planning group. Homeless. CSW assessing for appropriate referrals.   0/93: Pt application in review at [facility] and he has follow-up at [facility]. Plans to stay in hotel until bed becomes available.   2. Goal (s): Patient will exhibit decreased depressive symptoms and suicidal ideations.  Met: Yes    Target date: at discharge  As evidenced by: Patient will utilize self rating of depression at 3 or below and demonstrate decreased signs of depression or be deemed stable for discharge by MD.  8/8: Pt rates depression as high and reports no SI/HI/AVH today.   8/10: Pt rates depression as 0/10 today. Pleasant and calm in group.  3. Goal(s): Patient will demonstrate decreased signs of withdrawal due to substance abuse  Met:Yes   Target date:at discharge   As evidenced by: Patient will produce a CIWA/COWS score of 0, have stable vitals signs, and no symptoms of withdrawal.   8/8: Pt reports mild withdrawals with CIWA score of 2 and high  sitting BP/low sitting pulse.    8/10: pt reports no signs of withdrawal today with CIWA score of 0 and stable vitals.   Attendees: Patient:   06/29/2015 10:23 AM   Family:   06/29/2015 10:23 AM   Physician:  Dr. Carlton Adam, MD 06/29/2015 10:23 AM   Nursing:   Larry Sierras; Christa RN 06/29/2015 10:23 AM   Clinical Social Worker: Maxie Better, [place]  06/29/2015 10:23 AM   Clinical Social Worker: Erasmo Downer Drinkard LCSWA; Peri Maris LCSWA 06/29/2015 10:23 AM   Other:  Gerline Legacy Nurse Case Manager 06/29/2015 10:23 AM   Other:  Lucinda Dell; [facility]  06/29/2015 10:23 AM   Other:   06/29/2015 10:23 AM   Other:  06/29/2015 10:23 AM   Other:  06/29/2015 10:23 AM   Other:  06/29/2015 10:23 AM    06/29/2015 10:23 AM    06/29/2015 10:23 AM    06/29/2015 10:23 AM    06/29/2015 10:23 AM    Scribe for Treatment Team:   Maxie Better, [place]  06/29/2015 10:23 AM

## 2015-06-29 NOTE — Discharge Summary (Signed)
Physician Discharge Summary Note  Patient:  Jimmy Barron is an 49 y.o., male MRN:  [id number] DOB:  [birth year] Patient phone:  [phone] (home)  Patient address:   [address] [place] [address],  Total Time spent with patient: 30 minutes  Date of Admission:  06/25/2015 Date of Discharge: 06/29/2015  Reason for Admission:  ETOH abuse  Principal Problem: Alcohol use disorder, severe, dependence Discharge Diagnoses: Patient Active Problem List   Diagnosis Date Noted  . MDD (major depressive disorder), recurrent, severe, with psychosis [F33.3] 06/26/2015  . Alcohol use disorder, severe, dependence [F10.20] 06/25/2015  . Depression [F32.9] 03/13/2013  . Testicular pain [N50.8] 07/18/2012  . Erectile dysfunction [N52.9] 07/18/2012  . Chronic pain in the left leg status post femur fracture and ORIF [G89.29] 07/18/2012  . Preventive measure [Z41.8] 07/18/2012  . Alcoholism [F10.20]   . Restless legs syndrome (RLS) [G25.81]     Musculoskeletal: Strength & Muscle Tone: within normal limits Gait & Station: normal Patient leans: N/A  Psychiatric Specialty Exam: Physical Exam  Vitals reviewed.   Review of Systems  All other systems reviewed and are negative.   Blood pressure 109/77, pulse 68, temperature 97.5 F (36.4 C), temperature source Oral, resp. rate 16, height 6\' 2"  (1.88 m), weight 77.111 kg (170 lb).Body mass index is 21.82 kg/(m^2).   General Appearance: Fairly Groomed  Patent attorney:: Fair  Speech: Clear and Coherent  Volume: Normal  Mood: Euthymic  Affect: Appropriate  Thought Process: Coherent and Goal Directed  Orientation: Full (Time, Place, and Person)  Thought Content: plans as he moves on, relapse prevention plan  Suicidal Thoughts: No  Homicidal Thoughts: No  Memory: Immediate; Fair Recent; Fair Remote; Fair  Judgement: Fair  Insight: Present  Psychomotor Activity: Normal  Concentration: Fair   Recall: [employer] of Knowledge: Fair  Language: Fair  Akathisia: No  Handed: Right  AIMS (if indicated):    Assets: Desire for Improvement  Sleep: Number of Hours: 3.75  Cognition: WNL  ADL's: Intact       Have you used any form of tobacco in the last 30 days? (Cigarettes, Smokeless Tobacco, Cigars, and/or Pipes): Yes  Has this patient used any form of tobacco in the last 30 days? (Cigarettes, Smokeless Tobacco, Cigars, and/or Pipes) N/A   Past Medical History:  Past Medical History  Diagnosis Date  . Alcoholism   . Depression   . Restless legs syndrome (RLS)   . Chronic back pain   . Sciatica   . Chronic leg pain     left    Past Surgical History  Procedure Laterality Date  . Leg surgery      left   Family History:  Family History  Problem Relation Age of Onset  . Alcohol abuse Father   . Cancer Father     lung  . Alzheimer's disease Mother    Social History:  History  Alcohol Use  . Yes    Comment: daily- pt reports "drinks all day."     History  Drug Use No    Social History   Social History  . Marital Status: Divorced    Spouse Name: N/A  . Number of Children: N/A  . Years of Education: N/A   Social History Main Topics  . Smoking status: Current Every Day Smoker -- 1.00 packs/day for 30 years    Types: Cigarettes  . Smokeless tobacco: Former Neurosurgeon    Types: Chew  . Alcohol Use: Yes  Comment: daily- pt reports "drinks all day."  . Drug Use: No  . Sexual Activity: Not Currently   Other Topics Concern  . None   Social History Narrative    Risk to Self: Is patient at risk for suicide?: No What has been your use of drugs/alcohol within the last 12 months?: 8-10 40 oz beers daily for years; cocaine occassionally Risk to Others:   Prior Inpatient Therapy:   Prior Outpatient Therapy:    Level of Care:  OP  Hospital Course:  Jimmy Barron is an 49 y.o. male who came to [facility] requesting alcohol detox and occasional cocaine  use. He states he has been drinking 8-10 40 oz beers per day since about last December.  He reports depressive symptoms with suicidal ideations off and on with not specific plan or intent. He states hx of hearing voices and several suicidal attempts over the years   that he chronically off an on "hears voices of people behind him, but they are not there, and i look like a fool". Pt has a history of several suicide attempts over the years including one about 9 years ago in which he jumped off a bridge and now has metal rods in his legs. He states he has had several pyschiatric admissions over the years.  During assessment, pt was casually dressed, and cooperative, with depressed mood and sad affect. He denies HI, current SI, AVH and history of violence. There was no evidence of pt responding to internal stimuli. He was oriented to person, place and situation, but did not know what day or month it is. Pt's movement, thought content and speech were normal. He states that he did get some treatment 9 months ago at [facility] house, but has no current Op providers.  On 06/26/15, H&P performed. Pt seen and chart reviewed. Pt reports that he is concerned about his alcohol consumption which he states is 8-10 forty-ounce beers daily. 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 Pt also reports SSI income and that he is OK financially. Pt has many somatic complaints including bilateral leg pain, abdominal pain, and lower back pain, some of which he claims is from jumping from a bridge 9 years ago. Pt now clearly denies suicidal/homicidal ideation and does not appear to be responding to internal stimuli. However, he does report auditory hallucinations in the form of mumbling. Pt is calm, cooperative, alert/oriented x4, and appropriate during assessment  Consults:  psychiatry  Significant Diagnostic Studies:  labs: per ED  Discharge Vitals:   Blood pressure 109/77, pulse 68,  temperature 97.5 F (36.4 C), temperature source Oral, resp. rate 16, height  (1.88 m), weight 77.111 kg (170 lb). Body mass index is 21.82 kg/(m^2). Lab Results:   Results for orders placed or performed during the hospital encounter of 06/25/15 (from the past 72 hour(s))  Ammonia     Status: None   Collection Time: 06/28/15  7:15 PM  Result Value Ref Range   Ammonia 29 9 - 35 umol/L    Comment: Performed at [facility]    Physical Findings: AIMS: Facial and Oral Movements Muscles of Facial Expression: None, normal Lips and Perioral Area: None, normal Jaw: None, normal Tongue: None, normal,Extremity Movements Upper (arms, wrists, hands, fingers): None, normal Lower (legs, knees, ankles, toes): None, normal, Trunk Movements Neck, shoulders, hips: None, normal, Overall Severity Severity of abnormal movements (highest score from questions above): None, normal Incapacitation due to  abnormal movements: None, normal Patient's awareness of abnormal movements (rate only patient's report): No Awareness, Dental Status Current problems with teeth and/or dentures?: No Does patient usually wear dentures?: No  CIWA:  CIWA-Ar Total: 0 COWS:  COWS Total Score: 7   See Psychiatric Specialty Exam and Suicide Risk Assessment completed by Attending Physician prior to discharge.  Discharge destination:  Home  Is patient on multiple antipsychotic therapies at discharge:  No   Has Patient had three or more failed trials of antipsychotic monotherapy by history:  No    Recommended Plan for Multiple Antipsychotic Therapies: NA     Medication List    STOP taking these medications        cyclobenzaprine 10 MG tablet  Commonly known as:  FLEXERIL     gabapentin 300 MG capsule  Commonly known as:  NEURONTIN     hydrOXYzine 50 MG tablet  Commonly known as:  ATARAX/VISTARIL     meloxicam 15 MG tablet  Commonly known as:  MOBIC     methocarbamol 500 MG tablet   Commonly known as:  ROBAXIN     naproxen 500 MG tablet  Commonly known as:  NAPROSYN     predniSONE 50 MG tablet  Commonly known as:  DELTASONE     sildenafil 100 MG tablet  Commonly known as:  VIAGRA     traMADol 50 MG tablet  Commonly known as:  ULTRAM     venlafaxine XR 150 MG 24 hr capsule  Commonly known as:  EFFEXOR-XR      TAKE these medications      Indication   rOPINIRole 2 MG tablet  Commonly known as:  REQUIP  Take 1 tablet (2 mg total) by mouth at bedtime.   Indication:  Restless Leg Syndrome     traZODone 50 MG tablet  Commonly known as:  DESYREL  Take 1 tablet (50 mg total) by mouth at bedtime and may repeat dose one time if needed.   Indication:  Trouble Sleeping, Major Depressive Disorder       Follow-up Information    Follow up with [facility].   Why:  Call [facility] in admissions at discharge to check status of referral.    Contact information:   [address] [place] [address] Phone: [phone] Fax: [phone]      Follow up with Arna Medici On 07/01/2015.   Why:  Appt for hospital follow-up/medication management on this date at 8:00AM. If you are a new client, please bring photo ID, social security card, proof of income/disability check, and Medicare card.    Contact information:   [address] [place], [place] [address] Phone: [phone] Fax: [phone]      Follow-up recommendations:  Activity:  as tol, diet as tol  Comments:  1.  Take all your medications as prescribed.              2.  Report any adverse side effects to outpatient provider.                       3.  Patient instructed to not use alcohol or illegal drugs while on prescription medicines.            4.  In the event of worsening symptoms, instructed patient to call 911, the crisis hotline or go to nearest emergency room for evaluation of symptoms.  Total Discharge Time:  30 min  Signed: Velna Hatchet May Agustin AGNP-BC 06/29/2015, 2:38 PM  I personally assessed  the patient and formulated the plan Madie Reno A. Dub Mikes, M.D.

## 2015-07-07 ENCOUNTER — Encounter (HOSPITAL_COMMUNITY): Payer: Self-pay

## 2015-07-07 ENCOUNTER — Emergency Department (HOSPITAL_COMMUNITY)
Admission: EM | Admit: 2015-07-07 | Discharge: 2015-07-07 | Disposition: A | Discharge status: Home / Self Care | Payer: Medicare Other | Attending: Emergency Medicine | Admitting: Emergency Medicine

## 2015-07-07 DIAGNOSIS — R1030 Lower abdominal pain, unspecified: Secondary | ICD-10-CM | POA: Diagnosis not present

## 2015-07-07 DIAGNOSIS — F329 Major depressive disorder, single episode, unspecified: Secondary | ICD-10-CM | POA: Diagnosis not present

## 2015-07-07 DIAGNOSIS — Z8739 Personal history of other diseases of the musculoskeletal system and connective tissue: Secondary | ICD-10-CM | POA: Insufficient documentation

## 2015-07-07 DIAGNOSIS — Z720 Tobacco use: Secondary | ICD-10-CM | POA: Insufficient documentation

## 2015-07-07 DIAGNOSIS — F1020 Alcohol dependence, uncomplicated: Secondary | ICD-10-CM | POA: Insufficient documentation

## 2015-07-07 DIAGNOSIS — G8929 Other chronic pain: Secondary | ICD-10-CM | POA: Insufficient documentation

## 2015-07-07 DIAGNOSIS — Z008 Encounter for other general examination: Secondary | ICD-10-CM | POA: Diagnosis present

## 2015-07-07 LAB — URINALYSIS, ROUTINE W REFLEX MICROSCOPIC
Bilirubin Urine: NEGATIVE
Glucose, UA: NEGATIVE mg/dL
Hgb urine dipstick: NEGATIVE
Ketones, ur: NEGATIVE mg/dL
Leukocytes, UA: NEGATIVE
Nitrite: NEGATIVE
Protein, ur: NEGATIVE mg/dL
Specific Gravity, Urine: 1.01 (ref 1.005–1.030)
UROBILINOGEN UA: 0.2 mg/dL (ref 0.0–1.0)
pH: 6 (ref 5.0–8.0)

## 2015-07-07 LAB — RAPID URINE DRUG SCREEN, HOSP PERFORMED
Amphetamines: NOT DETECTED
Barbiturates: NOT DETECTED
Benzodiazepines: NOT DETECTED
Cocaine: NOT DETECTED
OPIATES: NOT DETECTED
Tetrahydrocannabinol: NOT DETECTED

## 2015-07-07 LAB — CBC WITH DIFFERENTIAL/PLATELET
Basophils Absolute: 0 10*3/uL (ref 0.0–0.1)
Basophils Relative: 1 % (ref 0–1)
EOS PCT: 2 % (ref 0–5)
Eosinophils Absolute: 0.1 10*3/uL (ref 0.0–0.7)
HEMATOCRIT: 45.5 % (ref 39.0–52.0)
Hemoglobin: 15.1 g/dL (ref 13.0–17.0)
LYMPHS PCT: 29 % (ref 12–46)
Lymphs Abs: 1.2 10*3/uL (ref 0.7–4.0)
MCH: 35 pg — AB (ref 26.0–34.0)
MCHC: 33.2 g/dL (ref 30.0–36.0)
MCV: 105.6 fL — AB (ref 78.0–100.0)
MONO ABS: 0.5 10*3/uL (ref 0.1–1.0)
MONOS PCT: 14 % — AB (ref 3–12)
Neutro Abs: 2.2 10*3/uL (ref 1.7–7.7)
Neutrophils Relative %: 54 % (ref 43–77)
Platelets: 186 10*3/uL (ref 150–400)
RBC: 4.31 MIL/uL (ref 4.22–5.81)
RDW: 13.3 % (ref 11.5–15.5)
WBC: 4 10*3/uL (ref 4.0–10.5)

## 2015-07-07 LAB — BASIC METABOLIC PANEL
Anion gap: 9 (ref 5–15)
BUN: 10 mg/dL (ref 6–20)
CO2: 27 mmol/L (ref 22–32)
Calcium: 8.9 mg/dL (ref 8.9–10.3)
Chloride: 102 mmol/L (ref 101–111)
Creatinine, Ser: 0.64 mg/dL (ref 0.61–1.24)
GFR calc Af Amer: >60 mL/min (ref >60)
GFR calc non Af Amer: >60 mL/min (ref >60)
Glucose, Bld: 96 mg/dL (ref 65–99)
Potassium: 4.6 mmol/L (ref 3.5–5.1)
Sodium: 138 mmol/L (ref 135–145)

## 2015-07-07 LAB — ETHANOL: Alcohol, Ethyl (B): 90 mg/dL — ABNORMAL HIGH (ref <5)

## 2015-07-07 MED ORDER — LORAZEPAM 1 MG PO TABS: 1.0000 mg | ORAL_TABLET | Freq: Three times a day (TID) | ORAL | Status: DC | PRN

## 2015-07-07 MED ORDER — PROMETHAZINE HCL 25 MG PO TABS: 25.0000 mg | ORAL_TABLET | Freq: Four times a day (QID) | ORAL | Status: DC | PRN

## 2015-07-07 NOTE — Discharge Instructions (Signed)
Follow-up at [facility].   Registered nurse will give your information. Medication for anxiety and nausea.

## 2015-07-07 NOTE — ED Notes (Signed)
Pt changed into paper scrubs. Wanded by security. Urine specimen obtained.

## 2015-07-07 NOTE — ED Notes (Signed)
Pt states he spent six days at behavioral health. States nothing is going right in his life and he can't keep going. States he feels suicidal and he drinks everyday

## 2015-07-07 NOTE — ED Notes (Signed)
TTS consult completed 

## 2015-07-07 NOTE — ED Provider Notes (Signed)
CSN: [id number]     Arrival date & time 07/07/15  4098 History  This chart was scribed for Jimmy Hutching, MD by Elon Spanner, ED Scribe. This patient was seen in room [facility] and the patient's care was started at 2:21 PM.   Chief Complaint  Patient presents with  . V70.1   The history is provided by the patient. No language interpreter was used.   HPI Comments: Jimmy Barron is a 49 y.o. male who presents to the Emergency Department complaining of SI without a plan.   He typically drinks 8-10 40 oz's per day and cites deteriorating relations with his family and resulting isolation as the primary causes of his depression/SI.  He also requests detox from alcohol and expresses a desire for long-term in-patient alcohol use treatment.  His most recent drink this morning (< 40 oz of beer).  There is also some chronic leg and hip pain.  He denies nausea.    Past Medical History  Diagnosis Date  . Alcoholism   . Depression   . Restless legs syndrome (RLS)   . Chronic back pain   . Sciatica   . Chronic leg pain     left   Past Surgical History  Procedure Laterality Date  . Leg surgery      left   Family History  Problem Relation Age of Onset  . Alcohol abuse Father   . Cancer Father     lung  . Alzheimer's disease Mother    Social History  Substance Use Topics  . Smoking status: Current Every Day Smoker -- 1.00 packs/day for 30 years    Types: Cigarettes  . Smokeless tobacco: Former Neurosurgeon    Types: Chew  . Alcohol Use: Yes     Comment: daily- pt reports "drinks all day."    Review of Systems A complete 10 system review of systems was obtained and all systems are negative except as noted in the HPI and PMH.   Allergies  Review of patient's allergies indicates no known allergies.  Home Medications   Prior to Admission medications   Medication Sig Start Date End Date Taking? Authorizing Provider  rOPINIRole (REQUIP) 2 MG tablet Take 1 tablet (2 mg total) by mouth at bedtime.  06/29/15  Yes Adonis Brook, NP  traZODone (DESYREL) 50 MG tablet Take 1 tablet (50 mg total) by mouth at bedtime and may repeat dose one time if needed. 06/29/15  Yes Adonis Brook, NP  LORazepam (ATIVAN) 1 MG tablet Take 1 tablet (1 mg total) by mouth 3 (three) times daily as needed for anxiety. 07/07/15   Jimmy Hutching, MD  promethazine (PHENERGAN) 25 MG tablet Take 1 tablet (25 mg total) by mouth every 6 (six) hours as needed. 07/07/15   Jimmy Hutching, MD   BP 133/96 mmHg  Pulse 61  Temp(Src) 97.5 F (36.4 C) (Oral)  Resp 16  Ht 6\' 2"  (1.88 m)  Wt 175 lb (79.379 kg)  BMI 22.46 kg/m2  SpO2 100% Physical Exam  Constitutional: He is oriented to person, place, and time. He appears well-developed and well-nourished.  HENT:  Head: Normocephalic and atraumatic.  Eyes: Conjunctivae and EOM are normal. Pupils are equal, round, and reactive to light.  Neck: Normal range of motion. Neck supple.  Cardiovascular: Normal rate and regular rhythm.   Pulmonary/Chest: Effort normal and breath sounds normal.  Abdominal: Soft. Bowel sounds are normal.  Tender in left inguinal area.  When standing, hernia became more apparent and was ~  2.5 cm in diameter, reducible.    Musculoskeletal: Normal range of motion.  Neurological: He is alert and oriented to person, place, and time.  Skin: Skin is warm and dry.  Psychiatric:  Sad, depressed.   Nursing note and vitals reviewed.   ED Course  Procedures (including critical care time)  DIAGNOSTIC STUDIES: Oxygen Saturation is 100% on RA, normal by my interpretation.    COORDINATION OF CARE:  10:38 AM Will order non-narcotic pain medication and TTS consult.  Patient acknowledges and agrees with plan.    Labs Review Labs Reviewed  CBC WITH DIFFERENTIAL/PLATELET - Abnormal; Notable for the following:    MCV 105.6 (*)    MCH 35.0 (*)    Monocytes Relative 14 (*)    All other components within normal limits  ETHANOL - Abnormal; Notable for the following:     Alcohol, Ethyl (B) 90 (*)    All other components within normal limits  BASIC METABOLIC PANEL  URINALYSIS, ROUTINE W REFLEX MICROSCOPIC (NOT AT [facility])  URINE RAPID DRUG SCREEN, HOSP PERFORMED    Imaging Review No results found. I have personally reviewed and evaluated these images and lab results as part of my medical decision-making.   EKG Interpretation None      MDM   Final diagnoses:  Alcoholism    No suicidal or homicidal ideation at discharge. No psychosis. Resource guide given for outpatient alcohol treatment. Discharge medication Ativan 1 mg and Phenergan 25 mg  I personally performed the services described in this documentation, which was scribed in my presence. The recorded information has been reviewed and is accurate.    Jimmy Hutching, MD 07/07/15 [phone]

## 2015-07-07 NOTE — ED Notes (Signed)
MD Cook at bedside. 

## 2015-07-07 NOTE — BH Assessment (Addendum)
Tele Assessment Note   Jimmy Barron is an 49 y.o. male. The Pt arrived voluntarily to [facility] requesting alcohol detox. The Pt states "I was suicidal yesterday but not today." Pt denies HI. Pt reports hearing his name sometimes when he is walking. Pt states that he drinks 8-10 40oz beers a day. Pt reports the following withdrawal symptoms: shaking and muscle pain. Pt states that he is depressed about his alcoholism. According to the Pt, he was released from [facility] 2 weeks ago for SI and alcoholism. Pt states that he would like to come to [facility] in order to "keep his mind occupied and think good thoughts." Pt states he would like to go to [facility] for long-term treatment as well. Pt denies previous inpatient treatment for SA. Pt states that he is homeless. Pt denies abuse.   Writer consulted with Dr. Lucianne Muss. Per Dr. Lucianne Muss Pt does not meet inpatient criteria. Pt provided with contact information for [facility] and other local SA programs. Writer contacted [facility]. Per [facility] they have a detox bed available.  RN and EDP informed.    Axis I: Alcohol Abuse and Substance Induced Mood Disorder Axis II: Deferred Axis III:  Past Medical History  Diagnosis Date  . Alcoholism   . Depression   . Restless legs syndrome (RLS)   . Chronic back pain   . Sciatica   . Chronic leg pain     left   Axis IV: economic problems and educational problems Axis V: 51-60 moderate symptoms  Past Medical History:  Past Medical History  Diagnosis Date  . Alcoholism   . Depression   . Restless legs syndrome (RLS)   . Chronic back pain   . Sciatica   . Chronic leg pain     left    Past Surgical History  Procedure Laterality Date  . Leg surgery      left    Family History:  Family History  Problem Relation Age of Onset  . Alcohol abuse Father   . Cancer Father     lung  . Alzheimer's disease Mother     Social History:  reports that he has been smoking Cigarettes.  He has a 30 pack-year smoking history. He has quit using  smokeless tobacco. His smokeless tobacco use included Chew. He reports that he drinks alcohol. He reports that he does not use illicit drugs.  Additional Social History:  Alcohol / Drug Use Pain Medications: Pt denies  Prescriptions: Trazodone Over the Counter: Pt denies History of alcohol / drug use?: Yes Longest period of sobriety (when/how long): 3 months Negative Consequences of Use: Financial, Legal, Personal relationships, Work / School Withdrawal Symptoms: Agitation, Irritability, Sweats Substance #1 Name of Substance 1: Alcohol 1 - Age of First Use: 15 1 - Amount (size/oz): 8-10 40oz  1 - Frequency: daily 1 - Duration: ongoing 1 - Last Use / Amount: 07/07/15  CIWA: CIWA-Ar BP: 133/96 mmHg Pulse Rate: 61 COWS:    PATIENT STRENGTHS: (choose at least two) Average or above average intelligence Communication skills  Allergies: No Known Allergies  Home Medications:  (Not in a hospital admission)  OB/GYN Status:  No LMP for male patient.  General Assessment Data Location of Assessment: [facility] TTS Assessment: In system Is this a Tele or Face-to-Face Assessment?: Tele Assessment Is this an Initial Assessment or a Re-assessment for this encounter?: Initial Assessment Marital status: Divorced Stebbins name: NA Is patient pregnant?: No Pregnancy Status: No Living Arrangements: Other (Comment) (homeless) Can pt return  to current living arrangement?: Yes Admission Status: Voluntary Is patient capable of signing voluntary admission?: Yes Referral Source: Self/Family/Friend Insurance type: Medicare     Crisis Care Plan Living Arrangements: Other (Comment) (homeless) Name of Psychiatrist: none Name of Therapist: none  Education Status Is patient currently in school?: No Current Grade: NA Highest grade of school patient has completed: 10 Name of school: NA Contact person: NA  Risk to self with the past 6 months Suicidal Ideation: No-Not Currently/Within Last 6  Months Has patient been a risk to self within the past 6 months prior to admission? : No Suicidal Intent: No Has patient had any suicidal intent within the past 6 months prior to admission? : No Is patient at risk for suicide?: No Suicidal Plan?: No Has patient had any suicidal plan within the past 6 months prior to admission? : No Access to Means: No What has been your use of drugs/alcohol within the last 12 months?: Alcohol Previous Attempts/Gestures: Yes How many times?: 1 Other Self Harm Risks: NA Triggers for Past Attempts: Unpredictable Intentional Self Injurious Behavior: None Family Suicide History: Yes Recent stressful life event(s): Other (Comment) Persecutory voices/beliefs?: No Depression: Yes Depression Symptoms: Insomnia, Loss of interest in usual pleasures, Feeling worthless/self pity, Feeling angry/irritable Substance abuse history and/or treatment for substance abuse?: Yes (alcoholism) Suicide prevention information given to non-admitted patients: Not applicable  Risk to Others within the past 6 months Homicidal Ideation: No Does patient have any lifetime risk of violence toward others beyond the six months prior to admission? : No Thoughts of Harm to Others: No Current Homicidal Intent: No Current Homicidal Plan: No Access to Homicidal Means: No Identified Victim: Na History of harm to others?: No Assessment of Violence: None Noted Violent Behavior Description: NA Does patient have access to weapons?: No Criminal Charges Pending?: No Does patient have a court date: No Is patient on probation?: No  Psychosis Hallucinations: Auditory (hear his name when walking) Delusions: None noted  Mental Status Report Appearance/Hygiene: Disheveled Eye Contact: Fair Motor Activity: Freedom of movement Speech: Logical/coherent Level of Consciousness: Alert Mood: Depressed Affect: Other (Comment) Anxiety Level: None Thought Processes: Coherent, Relevant Judgement:  Unimpaired Orientation: Person, Place, Situation, Appropriate for developmental age Obsessive Compulsive Thoughts/Behaviors: None  Cognitive Functioning Concentration: Normal Memory: Recent Intact, Remote Intact IQ: Average Insight: Fair Impulse Control: Fair Appetite: Poor Weight Loss: 0 Weight Gain: 0 Sleep: Decreased Total Hours of Sleep: 2 Vegetative Symptoms: None  ADLScreening [facility] Assessment Services) Patient's cognitive ability adequate to safely complete daily activities?: Yes Patient able to express need for assistance with ADLs?: Yes Independently performs ADLs?: Yes (appropriate for developmental age)  Prior Inpatient Therapy Prior Inpatient Therapy: Yes Prior Therapy Dates:  2 weeks ago Prior Therapy Facilty/Provider(s): [facility] Reason for Treatment: ETOH  Prior Outpatient Therapy Prior Outpatient Therapy: No Prior Therapy Dates: NA Prior Therapy Facilty/Provider(s): NA Reason for Treatment: NA Does patient have an ACCT team?: No Does patient have Intensive In-House Services?  : No Does patient have [facility] services? : No Does patient have P4CC services?: No  ADL Screening (condition at time of admission) Patient's cognitive ability adequate to safely complete daily activities?: Yes Is the patient deaf or have difficulty hearing?: No Does the patient have difficulty seeing, even when wearing glasses/contacts?: No Does the patient have difficulty concentrating, remembering, or making decisions?: No Patient able to express need for assistance with ADLs?: Yes Does the patient have difficulty dressing or bathing?: No Independently performs ADLs?: Yes (appropriate for developmental  age) Does the patient have difficulty walking or climbing stairs?: No Weakness of Legs: None Weakness of Arms/Hands: None       Abuse/Neglect Assessment (Assessment to be complete while patient is alone) Physical Abuse: Denies Verbal Abuse: Denies Sexual Abuse:  Denies Exploitation of patient/patient's resources: Denies Self-Neglect: Denies Values / Beliefs Cultural Requests During Hospitalization: None Spiritual Requests During Hospitalization: None   Advance Directives (For Healthcare) Does patient have an advance directive?: No Would patient like information on creating an advanced directive?: No - patient declined information    Additional Information 1:1 In Past 12 Months?: No CIRT Risk: No Elopement Risk: No Does patient have medical clearance?: Yes     Disposition:  Disposition Initial Assessment Completed for this Encounter: Yes Disposition of Patient: Outpatient treatment Type of inpatient treatment program: Adult Type of outpatient treatment: Adult  Harvis Mabus D 07/07/2015 11:51 AM

## 2015-07-07 NOTE — ED Notes (Signed)
TTS Consult in progress. 

## 2015-07-07 NOTE — ED Notes (Signed)
Spoke with BH. Pt does not meet inpatient criteria. Pt requesting resources for [facility]. [facility] has a bed available. Pt will have to set up transportation to facility. MD Adriana Simas notified.

## 2016-09-28 ENCOUNTER — Emergency Department (HOSPITAL_COMMUNITY): Payer: Medicare Other

## 2016-09-28 ENCOUNTER — Emergency Department (HOSPITAL_COMMUNITY)
Admission: EM | Admit: 2016-09-28 | Discharge: 2016-09-28 | Disposition: A | Discharge status: Home / Self Care | Payer: Medicare Other | Attending: Emergency Medicine | Admitting: Emergency Medicine

## 2016-09-28 ENCOUNTER — Encounter (HOSPITAL_COMMUNITY): Payer: Self-pay | Admitting: Emergency Medicine

## 2016-09-28 DIAGNOSIS — M25571 Pain in right ankle and joints of right foot: Secondary | ICD-10-CM | POA: Diagnosis not present

## 2016-09-28 DIAGNOSIS — T426X1A Poisoning by other antiepileptic and sedative-hypnotic drugs, accidental (unintentional), initial encounter: Secondary | ICD-10-CM | POA: Insufficient documentation

## 2016-09-28 DIAGNOSIS — F17210 Nicotine dependence, cigarettes, uncomplicated: Secondary | ICD-10-CM | POA: Diagnosis not present

## 2016-09-28 DIAGNOSIS — Z79899 Other long term (current) drug therapy: Secondary | ICD-10-CM | POA: Insufficient documentation

## 2016-09-28 DIAGNOSIS — T50901A Poisoning by unspecified drugs, medicaments and biological substances, accidental (unintentional), initial encounter: Secondary | ICD-10-CM

## 2016-09-28 DIAGNOSIS — M79605 Pain in left leg: Secondary | ICD-10-CM | POA: Diagnosis present

## 2016-09-28 MED ORDER — HYDROCODONE-ACETAMINOPHEN 5-325 MG PO TABS: 1.0000 | ORAL_TABLET | Freq: Four times a day (QID) | ORAL | 0 refills | Status: AC | PRN

## 2016-09-28 NOTE — ED Provider Notes (Addendum)
[facility]ER[facility]Y DEPT Provider Note   CSN: [id number] Arrival date & time: 09/28/16  0807  By signing my name below, I, Soijett Blue, attest that this documentation has been prepared under the direction and in the presence of Heide Scales, MD. Electronically Signed: Soijett Blue, ED Scribe. 09/28/16. 9:28 AM.   History   Chief Complaint Chief Complaint  Patient presents with  . Leg Pain    HPI Jimmy Barron is a 50 y.o. male with a PMHx of chronic left leg pain, restless leg syndrome, who presents to the Emergency Department complaining of 9/10 left upper leg pain onset 7-8 years worsening yesterday. Pt states that he had a rod placed to his left upper leg 7-8 years ago due to falling over 25 feet in an attempt to commuit suicide and shattering his left femur and notes that he is having recurrent pain to the site. Pt state that it feels as if the rod is twisting and would like it evaluated. Pt notes that he took 300 mg gabapentin yesterday 5 at a time totaling 40 pills in a 24 hour time period in order to relieve his left upper leg pain. Pt denies beng suicidal at this time. Pt states that he has been taking gabapentin x 8 years due to his pain. Pt reports that he fell onto his left leg several months ago and notes that he was in [place] and evaluated and informed that the rod was in place. Pt is having associated symptoms of chronic numbness and tingling to left leg and right ankle pain x 4 months ago. Pt states that he twisted his right ankle 5 months ago and wasn't evaluated for his symptoms. He notes that he has tried his friends Rx of oxycodone with mild relief of his symptoms. He denies nausea, vomiting, seizures, LOC, abdominal pain, CP, palpitations, back pain, fever, chills, HA, SI and any other symptoms.   Patient denied any suicidal or homicidal intent.  The history is provided by the patient. No language interpreter was used.  Leg Pain   This is a chronic problem.  Episode onset: 7-8 years. The problem occurs constantly. The problem has not changed since onset.The pain is present in the left upper leg. The pain is at a severity of 9/10. The pain is moderate. Associated symptoms include numbness (chronic to left upper leg) and tingling. The symptoms are aggravated by standing and activity. Treatments tried: gabapentin. The treatment provided no relief. There has been a history of trauma (fall from 25 feet 8 years ago, fractured femur with rod placed).    Past Medical History:  Diagnosis Date  . Alcoholism (HCC)   . Chronic back pain   . Chronic leg pain    left  . Depression   . Restless legs syndrome (RLS)   . Sciatica     Patient Active Problem List   Diagnosis Date Noted  . MDD (major depressive disorder), recurrent, severe, with psychosis (HCC) 06/26/2015  . Alcohol use disorder, severe, dependence (HCC) 06/25/2015  . Depression 03/13/2013  . Testicular pain 07/18/2012  . Erectile dysfunction 07/18/2012  . Chronic pain in the left leg status post femur fracture and ORIF 07/18/2012  . Preventive measure 07/18/2012  . Alcoholism (HCC)   . Restless legs syndrome (RLS)     Past Surgical History:  Procedure Laterality Date  . LEG SURGERY     left       Home Medications    Prior to Admission medications  Medication Sig Start Date End Date Taking? Authorizing Provider  LORazepam (ATIVAN) 1 MG tablet Take 1 tablet (1 mg total) by mouth 3 (three) times daily as needed for anxiety. 07/07/15   Donnetta HutchingBrian Cook, MD  promethazine (PHENERGAN) 25 MG tablet Take 1 tablet (25 mg total) by mouth every 6 (six) hours as needed. 07/07/15   Donnetta HutchingBrian Cook, MD  rOPINIRole (REQUIP) 2 MG tablet Take 1 tablet (2 mg total) by mouth at bedtime. 06/29/15   Adonis BrookSheila Agustin, NP  traZODone (DESYREL) 50 MG tablet Take 1 tablet (50 mg total) by mouth at bedtime and may repeat dose one time if needed. 06/29/15   Adonis BrookSheila Agustin, NP    Family History Family History  Problem  Relation Age of Onset  . Alcohol abuse Father   . Cancer Father     lung  . Alzheimer's disease Mother     Social History Social History  Substance Use Topics  . Smoking status: Current Every Day Smoker    Packs/day: 1.00    Years: 30.00    Types: Cigarettes  . Smokeless tobacco: Former NeurosurgeonUser    Types: Chew  . Alcohol use Yes     Comment: daily- pt reports "drinks all day."     Allergies   Patient has no known allergies.   Review of Systems Review of Systems  Constitutional: Negative for activity change, chills, diaphoresis, fatigue and fever.  HENT: Negative for congestion and rhinorrhea.   Eyes: Negative for visual disturbance.  Respiratory: Negative for cough, chest tightness, shortness of breath, wheezing and stridor.   Cardiovascular: Negative for chest pain, palpitations and leg swelling.  Gastrointestinal: Negative for abdominal distention, abdominal pain, blood in stool, constipation, diarrhea, nausea and vomiting.  Genitourinary: Negative for difficulty urinating, dysuria and flank pain.  Musculoskeletal: Positive for arthralgias (left upper leg and right ankle). Negative for back pain and gait problem.  Skin: Negative for rash and wound.  Neurological: Positive for tingling and numbness (chronic to left upper leg). Negative for dizziness, seizures, weakness, light-headedness and headaches.       Chronic tingling to left upper leg  Psychiatric/Behavioral: Negative for agitation and suicidal ideas.  All other systems reviewed and are negative.    Physical Exam Updated Vital Signs BP 136/97 (BP Location: Left Arm)   Pulse 60   Temp 97.8 F (36.6 C) (Oral)   Resp 18   Ht 6\' 2"  (1.88 m)   Wt 175 lb (79.4 kg)   SpO2 99%   BMI 22.47 kg/m   Physical Exam  Constitutional: He is oriented to person, place, and time. He appears well-developed and well-nourished. No distress.  HENT:  Head: Normocephalic and atraumatic.  Right Ear: External ear normal.  Left  Ear: External ear normal.  Nose: Nose normal.  Mouth/Throat: Oropharynx is clear and moist. No oropharyngeal exudate.  Eyes: Conjunctivae and EOM are normal. Pupils are equal, round, and reactive to light.  Neck: Normal range of motion. Neck supple.  Cardiovascular: Normal rate, regular rhythm and normal heart sounds.  Exam reveals no gallop and no friction rub.   No murmur heard. Pulmonary/Chest: Effort normal and breath sounds normal. No stridor. No respiratory distress. He has no wheezes. He has no rales.  Abdominal: Soft. There is no tenderness. There is no rebound and no guarding.  Musculoskeletal: He exhibits no edema.       Right ankle: Tenderness.       Left upper leg: He exhibits tenderness.  Mild tenderness to left  lateral thigh and right ankle.  Neurological: He is alert and oriented to person, place, and time. He displays normal reflexes. No cranial nerve deficit. He exhibits normal muscle tone. Coordination normal.  Skin: Skin is warm. No rash noted. He is not diaphoretic. No erythema.     ED Treatments / Results  DIAGNOSTIC STUDIES: Oxygen Saturation is 99% on RA, nl by my interpretation.    COORDINATION OF CARE: 9:22 AM Discussed treatment plan with pt at bedside which includes right ankle xray, left femur xray, consult poison control, and pt agreed to plan.  9:29 AM- Consult with poison control, who state that since the patient consumed less than 35 grams of gabapentin, that there is no need to complete labs.   Radiology Dg Ankle Complete Right  Result Date: 09/28/2016 CLINICAL DATA:  Chronic pain EXAM: RIGHT ANKLE - COMPLETE 3+ VIEW COMPARISON:  None. FINDINGS: Frontal, oblique, and lateral views were obtained. There is no acute appearing fracture or joint effusion. Areas of sclerosis in the calcaneus raise question of prior injury in this area. The ankle mortise appears intact. There is osteoarthritic change in the medial and lateral malleolar regions as well as  throughout the talonavicular region. There is calcification in the distal Achilles tendon. 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 No acute fracture. Question prior fractures in the calcaneus with areas of sclerosis raising concern for potential areas of impaction, probably chronic. Ankle mortise appears intact. There is distal Achilles calcific tendinosis. Electronically Signed   By: Bretta BangWilliam  Woodruff III M.D.   On: 09/28/2016 10:23   Dg Femur Min 2 Views Left  Result Date: 09/28/2016 CLINICAL DATA:  Left leg pain EXAM: LEFT FEMUR 2 VIEWS COMPARISON:  05/03/2015 FINDINGS: Four views of the left femur submitted. No acute fracture or subluxation. Again noted proximal intra medullary rod and metallic fixation pin in left femur. The fixation material appears intact. There is anatomic alignment. No loosening of fixation material. IMPRESSION: Postsurgical changes in proximal left femur. No evidence of loosening of fixation material. No acute fracture or subluxation. Electronically Signed   By: Natasha MeadLiviu  Pop M.D.   On: 09/28/2016 10:21    Procedures Procedures (including critical care time)  Medications Ordered in ED Medications - No data to display   Initial Impression / Assessment and Plan / ED Course  I have reviewed the triage vital signs and the nursing notes.  Pertinent labs & imaging results that were available during my care of the patient were reviewed by me and considered in my medical decision making (see chart for details).  Clinical Course     Jimmy NeedsDale Correll is a 50 y.o. male with a PMHx of chronic left leg pain, restless leg syndrome, who presents to the Emergency Department complaining Left leg pain, right ankle pain, and possible accidental overdose.  History and exam are seen above.   Physical exam only remarkable for left upper leg tenderness and right angle tenderness. No ecchymosis, laceration, or evidence of trauma Appreciated. Patient  had completely unremarkable neurologic exam. Lungs clear. Normal gait. Nontender abdomen.   Poison control is called due to amount of gabapentin adjusted. Total calculated dose taken by patient was approximately 12 g. (40300 mg). Poison control reports that this amount is likely insufficient to cause harm. They reported the only abnormality likely seen at a digestion less than 35 total grams would be drowsiness. Patient was nondrowsy. As such, they did not feel the patient needed EKG, x-rays, or lab  testing.   Patient had x-rays obtained of left leg and right ankle. No evidence of hardware problem or fracture.  Patient given several pills of pain medication and instructed that he Barron to follow up with his PCP or pain specialist for further management. Patient discharged in good condition.   Final Clinical Impressions(s) / ED Diagnoses   Final diagnoses:  Left leg pain  Accidental overdose, initial encounter  Acute right ankle pain    New Prescriptions Discharge Medication List as of 09/28/2016 12:30 PM    START taking these medications   Details  HYDROcodone-acetaminophen (LORTAB) 5-325 MG tablet Take 1 tablet by mouth every 6 (six) hours as needed for moderate pain., Starting Fri 09/28/2016, Print        I personally performed the services described in this documentation, which was scribed in my presence. The recorded information has been reviewed and is accurate.   Clinical Impression: 1. Accidental overdose, initial encounter   2. Left leg pain   3. Acute right ankle pain     Disposition: Discharge  Condition: Good  I have discussed the results, Dx and Tx plan with the pt(& family if present). He/she/they expressed understanding and agree(s) with the plan. Discharge instructions discussed at great length. Strict return precautions discussed and pt &/or family have verbalized understanding of the instructions. No further questions at time of discharge.    Discharge  Medication List as of 09/28/2016 12:30 PM    START taking these medications   Details  HYDROcodone-acetaminophen (LORTAB) 5-325 MG tablet Take 1 tablet by mouth every 6 (six) hours as needed for moderate pain., Starting Fri 09/28/2016, Print        Follow Up: [facility] [address] [place] [address] [phone]    [facility] EMERGENCY DEPARTMENT [address] [id number] [address] [place] [address] [phone]  If symptoms worsen      Heide Scales, MD 09/29/16 1047    Canary Brim Tegeler, MD 09/29/16 1048

## 2016-09-28 NOTE — ED Notes (Addendum)
Pt c/o left leg pain, rates pain 9/10.  Pt says he took about 40 Gabapentin yesterday in 24 hour period.  Denies HI/SI.  Pt says he was trying to get pain to go away.

## 2016-09-28 NOTE — ED Triage Notes (Signed)
Pt reports left leg pain from rod put in leg 7-8 years ago.  Pt ambulatory, states that the change of weather makes leg hurt. Pt alert and oriented. Pt denies new injury on left leg.  Pt also wants right ankle checked due to pain for 4 months.

## 2016-09-28 NOTE — Discharge Instructions (Signed)
Please follow-up with a primary doctor for further pain management. Please follow-up with a pain specialist if primary care physician recommends this. Please be careful taking your home pain medications. If symptoms worsen, please return to the nearest ED.

## 2016-10-10 ENCOUNTER — Encounter (HOSPITAL_COMMUNITY): Payer: Self-pay | Admitting: Emergency Medicine

## 2016-10-10 ENCOUNTER — Emergency Department (HOSPITAL_COMMUNITY)
Admission: EM | Admit: 2016-10-10 | Discharge: 2016-10-10 | Disposition: A | Discharge status: Home / Self Care | Payer: Medicare Other | Attending: Emergency Medicine | Admitting: Emergency Medicine

## 2016-10-10 DIAGNOSIS — M79605 Pain in left leg: Secondary | ICD-10-CM | POA: Insufficient documentation

## 2016-10-10 DIAGNOSIS — F17210 Nicotine dependence, cigarettes, uncomplicated: Secondary | ICD-10-CM | POA: Insufficient documentation

## 2016-10-10 MED ORDER — DEXAMETHASONE 4 MG PO TABS: 4.0000 mg | ORAL_TABLET | Freq: Two times a day (BID) | ORAL | 0 refills | Status: AC

## 2016-10-10 MED ORDER — PREDNISONE 20 MG PO TABS
40.0000 mg | ORAL_TABLET | Freq: Once | ORAL | Status: AC
  Administered 2016-10-10: 40 mg via ORAL
  Filled 2016-10-10: qty 2

## 2016-10-10 MED ORDER — METHOCARBAMOL 500 MG PO TABS: 500.0000 mg | ORAL_TABLET | Freq: Three times a day (TID) | ORAL | 0 refills | Status: AC

## 2016-10-10 MED ORDER — ONDANSETRON HCL 4 MG PO TABS
4.0000 mg | ORAL_TABLET | Freq: Once | ORAL | Status: AC
  Administered 2016-10-10: 4 mg via ORAL
  Filled 2016-10-10: qty 1

## 2016-10-10 MED ORDER — KETOROLAC TROMETHAMINE 10 MG PO TABS
10.0000 mg | ORAL_TABLET | Freq: Once | ORAL | Status: AC
  Administered 2016-10-10: 10 mg via ORAL
  Filled 2016-10-10: qty 1

## 2016-10-10 MED ORDER — DIAZEPAM 5 MG PO TABS
5.0000 mg | ORAL_TABLET | Freq: Once | ORAL | Status: AC
  Administered 2016-10-10: 5 mg via ORAL
  Filled 2016-10-10: qty 1

## 2016-10-10 MED ORDER — DICLOFENAC SODIUM 75 MG PO TBEC: 75.0000 mg | DELAYED_RELEASE_TABLET | Freq: Two times a day (BID) | ORAL | 0 refills | Status: AC

## 2016-10-10 NOTE — ED Provider Notes (Signed)
[facility] DEPT Provider Note   CSN: [id number] Arrival date & time: 10/10/16  1246     History   Chief Complaint Chief Complaint  Patient presents with  . Leg Pain    HPI Jimmy Barron is a 50 y.o. male.  Pt is a 50 y/o male who presents to the Emergency Dept with a complaint of left leg pain. Patient states that approximately 8 or 9 use ago he sustained a fracture of his femur and had to have a rod placed. The patient further states that he has been having problems with pain since that time. He presents today because he is having pain in the thigh in the area where the rod was placed. He denies any recent injury or trauma. It is of note that he was seen in the emergency department on November 10 for similar problem. He states that when he is on more than usual that it hurts. He also states that weather changes makes problem.    Leg Pain   Associated symptoms include numbness.    Past Medical History:  Diagnosis Date  . Alcoholism (HCC)   . Chronic back pain   . Chronic leg pain    left  . Depression   . Restless legs syndrome (RLS)   . Sciatica     Patient Active Problem List   Diagnosis Date Noted  . MDD (major depressive disorder), recurrent, severe, with psychosis (HCC) 06/26/2015  . Alcohol use disorder, severe, dependence (HCC) 06/25/2015  . Depression 03/13/2013  . Testicular pain 07/18/2012  . Erectile dysfunction 07/18/2012  . Chronic pain in the left leg status post femur fracture and ORIF 07/18/2012  . Preventive measure 07/18/2012  . Alcoholism (HCC)   . Restless legs syndrome (RLS)     Past Surgical History:  Procedure Laterality Date  . LEG SURGERY     left       Home Medications    Prior to Admission medications   Medication Sig Start Date End Date Taking? Authorizing Provider  dexamethasone (DECADRON) 4 MG tablet Take 1 tablet (4 mg total) by mouth 2 (two) times daily with a meal. 10/10/16   Ivery QualeHobson Amie Cowens, PA-C  diclofenac  (VOLTAREN) 75 MG EC tablet Take 1 tablet (75 mg total) by mouth 2 (two) times daily. 10/10/16   Ivery QualeHobson Jory Welke, PA-C  gabapentin (NEURONTIN) 300 MG capsule Take 300 mg by mouth 3 (three) times daily. Patient states he took 5 capsules at time, 8 times yesterday    Historical Provider, MD  HYDROcodone-acetaminophen (LORTAB) 5-325 MG tablet Take 1 tablet by mouth every 6 (six) hours as needed for moderate pain. 09/28/16   Canary Brimhristopher J Tegeler, MD  methocarbamol (ROBAXIN) 500 MG tablet Take 1 tablet (500 mg total) by mouth 3 (three) times daily. 10/10/16   Ivery QualeHobson Tesla Bochicchio, PA-C    Family History Family History  Problem Relation Age of Onset  . Alcohol abuse Father   . Cancer Father     lung  . Alzheimer's disease Mother     Social History Social History  Substance Use Topics  . Smoking status: Current Every Day Smoker    Packs/day: 1.00    Years: 30.00    Types: Cigarettes  . Smokeless tobacco: Former NeurosurgeonUser    Types: Chew  . Alcohol use Yes     Comment: pt states he has not had anything to drink in over a year     Allergies   Patient has no known allergies.   Review  of Systems Review of Systems  Musculoskeletal: Positive for arthralgias.       Leg pain  Neurological: Positive for numbness.  All other systems reviewed and are negative.    Physical Exam Updated Vital Signs BP 111/67 (BP Location: Left Arm)   Pulse 64   Temp 97.7 F (36.5 C) (Oral)   Resp 16   Ht 6\' 2"  (1.88 m)   Wt 79.4 kg   SpO2 98%   BMI 22.47 kg/m   Physical Exam  Constitutional: He is oriented to person, place, and time. He appears well-developed and well-nourished.  Non-toxic appearance.  HENT:  Head: Normocephalic.  Right Ear: Tympanic membrane and external ear normal.  Left Ear: Tympanic membrane and external ear normal.  Eyes: EOM and lids are normal. Pupils are equal, round, and reactive to light.  Neck: Normal range of motion. Neck supple. Carotid bruit is not present.  Cardiovascular:  Normal rate, regular rhythm, normal heart sounds, intact distal pulses and normal pulses.   Pulmonary/Chest: Breath sounds normal. No respiratory distress.  Abdominal: Soft. Bowel sounds are normal. There is no tenderness. There is no guarding.  Musculoskeletal: Normal range of motion.  Tenderness to palpation of the left eye, left side tenderness is greater than other areas. There is decreased range of motion of the left lower extremity. There is good range of motion of the right lower extremity.  Lymphadenopathy:       Head (right side): No submandibular adenopathy present.       Head (left side): No submandibular adenopathy present.    He has no cervical adenopathy.  Neurological: He is alert and oriented to person, place, and time. He has normal strength. No cranial nerve deficit or sensory deficit.  Skin: Skin is warm and dry.  Psychiatric: He has a normal mood and affect. His speech is normal.  Nursing note and vitals reviewed.    ED Treatments / Results  Labs (all labs ordered are listed, but only abnormal results are displayed) Labs Reviewed - No data to display  EKG  EKG Interpretation None       Radiology No results found.  Procedures Procedures (including critical care time)  Medications Ordered in ED Medications  diazepam (VALIUM) tablet 5 mg (5 mg Oral Given 10/10/16 1421)  ketorolac (TORADOL) tablet 10 mg (10 mg Oral Given 10/10/16 1420)  predniSONE (DELTASONE) tablet 40 mg (40 mg Oral Given 10/10/16 1421)  ondansetron (ZOFRAN) tablet 4 mg (4 mg Oral Given 10/10/16 1420)     Initial Impression / Assessment and Plan / ED Course  I have reviewed the triage vital signs and the nursing notes.  Pertinent labs & imaging results that were available during my care of the patient were reviewed by me and considered in my medical decision making (see chart for details).  Clinical Course   MDM - Vital signs within normal limits. There is no red streaking appreciated  no draining ulcers. Doubt infection. There's been no recent injury or trauma to the left upper thigh area. Doubt new fracture. No palpable deformity of the hip or knee or ankle. No temperature changes of the right versus the left lower extremity, doubt vascular embarrassment.  **I have reviewed nursing notes, vital signs, and all appropriate lab and imaging results for this patient.  Final Clinical Impressions(s) / ED Diagnoses  I requested the patient to see his primary physician for additional evaluation and management of this ongoing pain. Prescription for Decadron, diclofenac, and Robaxin given to the  patient. Patient is ambulatory at the time of discharge.    Final diagnoses:  Left leg pain    New Prescriptions New Prescriptions   DEXAMETHASONE (DECADRON) 4 MG TABLET    Take 1 tablet (4 mg total) by mouth 2 (two) times daily with a meal.   DICLOFENAC (VOLTAREN) 75 MG EC TABLET    Take 1 tablet (75 mg total) by mouth 2 (two) times daily.   METHOCARBAMOL (ROBAXIN) 500 MG TABLET    Take 1 tablet (500 mg total) by mouth 3 (three) times daily.     Ivery Quale, PA-C 10/10/16 1441    [name], MD 10/16/16 [phone]

## 2016-10-10 NOTE — Discharge Instructions (Signed)
Your vital signs within normal limits. Please use Robaxin, diclofenac, and Decadron as prescribed, please take these medications with food. Please see your primary physician for additional evaluation and management of this leg problem.

## 2016-10-10 NOTE — ED Triage Notes (Signed)
Pt c/o L leg pain. Pt sates he had a rod placed in his leg 9 years ago and has had pain since. Pt states the pain is worse with cold weather.
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
## 2018-06-19 DEATH — deceased
